# Patient Record
Sex: Female | Born: 1950 | Race: White | Hispanic: No | Smoking: Never smoker
Health system: Southern US, Community
[De-identification: ages and names within clinical notes are randomized; demographics above are authoritative.]

## PROBLEM LIST (undated history)

## (undated) DIAGNOSIS — M216X1 Other acquired deformities of right foot: Secondary | ICD-10-CM

## (undated) DIAGNOSIS — L509 Urticaria, unspecified: Secondary | ICD-10-CM

## (undated) DIAGNOSIS — J189 Pneumonia, unspecified organism: Secondary | ICD-10-CM

## (undated) DIAGNOSIS — D131 Benign neoplasm of stomach: Secondary | ICD-10-CM

## (undated) DIAGNOSIS — K219 Gastro-esophageal reflux disease without esophagitis: Secondary | ICD-10-CM

## (undated) DIAGNOSIS — I341 Nonrheumatic mitral (valve) prolapse: Secondary | ICD-10-CM

## (undated) DIAGNOSIS — T7840XA Allergy, unspecified, initial encounter: Secondary | ICD-10-CM

## (undated) DIAGNOSIS — M542 Cervicalgia: Secondary | ICD-10-CM

## (undated) DIAGNOSIS — G5761 Lesion of plantar nerve, right lower limb: Secondary | ICD-10-CM

## (undated) DIAGNOSIS — Z87442 Personal history of urinary calculi: Secondary | ICD-10-CM

## (undated) DIAGNOSIS — G4700 Insomnia, unspecified: Secondary | ICD-10-CM

## (undated) DIAGNOSIS — J309 Allergic rhinitis, unspecified: Secondary | ICD-10-CM

## (undated) DIAGNOSIS — K5792 Diverticulitis of intestine, part unspecified, without perforation or abscess without bleeding: Secondary | ICD-10-CM

## (undated) DIAGNOSIS — F419 Anxiety disorder, unspecified: Secondary | ICD-10-CM

## (undated) DIAGNOSIS — M810 Age-related osteoporosis without current pathological fracture: Secondary | ICD-10-CM

## (undated) DIAGNOSIS — M1990 Unspecified osteoarthritis, unspecified site: Secondary | ICD-10-CM

## (undated) HISTORY — DX: Diverticulitis of intestine, part unspecified, without perforation or abscess without bleeding: K57.92

## (undated) HISTORY — PX: ANKLE SURGERY: SHX546

## (undated) HISTORY — DX: Allergic rhinitis, unspecified: J30.9

## (undated) HISTORY — DX: Benign neoplasm of stomach: D13.1

## (undated) HISTORY — DX: Allergy, unspecified, initial encounter: T78.40XA

## (undated) HISTORY — PX: COLONOSCOPY: SHX174

## (undated) HISTORY — DX: Unspecified osteoarthritis, unspecified site: M19.90

## (undated) HISTORY — PX: TUBAL LIGATION: SHX77

## (undated) HISTORY — PX: UPPER GASTROINTESTINAL ENDOSCOPY: SHX188

## (undated) HISTORY — DX: Nonrheumatic mitral (valve) prolapse: I34.1

## (undated) HISTORY — DX: Age-related osteoporosis without current pathological fracture: M81.0

## (undated) HISTORY — DX: Insomnia, unspecified: G47.00

## (undated) HISTORY — PX: CHOLECYSTECTOMY: SHX55

## (undated) HISTORY — DX: Urticaria, unspecified: L50.9

## (undated) HISTORY — DX: Anxiety disorder, unspecified: F41.9

## (undated) HISTORY — PX: APPENDECTOMY: SHX54

---

## 1997-08-16 HISTORY — PX: ABDOMINAL HYSTERECTOMY: SHX81

## 2005-08-16 HISTORY — PX: COLONOSCOPY: SHX174

## 2011-04-17 HISTORY — PX: ESOPHAGOGASTRODUODENOSCOPY: SHX1529

## 2016-08-06 LAB — COLOGUARD: Cologuard: NEGATIVE

## 2016-12-22 DIAGNOSIS — M25512 Pain in left shoulder: Secondary | ICD-10-CM | POA: Insufficient documentation

## 2016-12-22 DIAGNOSIS — M542 Cervicalgia: Secondary | ICD-10-CM

## 2016-12-22 HISTORY — DX: Cervicalgia: M54.2

## 2017-02-23 ENCOUNTER — Encounter: Payer: Self-pay | Admitting: Internal Medicine

## 2017-03-28 DIAGNOSIS — M216X1 Other acquired deformities of right foot: Secondary | ICD-10-CM

## 2017-03-28 DIAGNOSIS — G5761 Lesion of plantar nerve, right lower limb: Secondary | ICD-10-CM | POA: Insufficient documentation

## 2017-03-28 HISTORY — DX: Lesion of plantar nerve, right lower limb: G57.61

## 2017-03-28 HISTORY — DX: Other acquired deformities of right foot: M21.6X1

## 2017-04-19 ENCOUNTER — Ambulatory Visit (INDEPENDENT_AMBULATORY_CARE_PROVIDER_SITE_OTHER): Payer: Medicare Other | Admitting: Internal Medicine

## 2017-04-19 ENCOUNTER — Encounter: Payer: Self-pay | Admitting: Internal Medicine

## 2017-04-19 ENCOUNTER — Encounter (INDEPENDENT_AMBULATORY_CARE_PROVIDER_SITE_OTHER): Payer: Self-pay

## 2017-04-19 VITALS — BP 126/74 | HR 121 | Ht 65.0 in | Wt 154.0 lb

## 2017-04-19 DIAGNOSIS — K2100 Gastro-esophageal reflux disease with esophagitis, without bleeding: Secondary | ICD-10-CM

## 2017-04-19 DIAGNOSIS — K449 Diaphragmatic hernia without obstruction or gangrene: Secondary | ICD-10-CM

## 2017-04-19 DIAGNOSIS — Z1211 Encounter for screening for malignant neoplasm of colon: Secondary | ICD-10-CM | POA: Diagnosis not present

## 2017-04-19 MED ORDER — PANTOPRAZOLE SODIUM 20 MG PO TBEC: 20.0000 mg | DELAYED_RELEASE_TABLET | Freq: Two times a day (BID) | ORAL | 11 refills | Status: DC

## 2017-04-19 NOTE — Patient Instructions (Addendum)
  You have been scheduled for an endoscopy. Please follow written instructions given to you at your visit today. If you use inhalers (even only as needed), please bring them with you on the day of your procedure. Your physician has requested that you go to www.startemmi.com and enter the access code given to you at your visit today. This web site gives a general overview about your procedure. However, you should still follow specific instructions given to you by our office regarding your preparation for the procedure.   We are giving you a handout to read and follow on GERD.   We will put you in the system for a colonoscopy recall for December 2020.   We have sent the following medications to your pharmacy for you to pick up at your convenience: Pantoprazole   I appreciate the opportunity to care for you. Silvano Rusk, MD, [facility]

## 2017-04-19 NOTE — Progress Notes (Signed)
Rebekah Schmidt 66 y.o. 1951/03/24 [id number]  Assessment & Plan:   Encounter Diagnoses  Name Primary?  . Gastroesophageal reflux disease with esophagitis Yes  . Hiatal hernia   . Colon cancer screening    She's having increasing symptoms despite treatment. She is worried about the possibility of Barrett's esophagus.  Will evaluate for mucosal damage with EGD.The risks and benefits as well as alternatives of endoscopic procedure(s) have been discussed and reviewed. All questions answered. The patient agrees to proceed.  Change PPI from omeprazole 20 mg daily to pantoprazole 20 mg twice a day before breakfast and supper. I advised her about the importance of taking it about 30-60 minutes before a meal for maximal effectiveness.  I think overall she is probably following reflux precautions with the exception of caffeine intake, she may be stuck on 2 cups of coffee a day. We will give her a diet and information sheet again for reinforcement. She will continue having the head of the bed elevated.  I would consider repeating a colonoscopy 3 years after her Cologuard versus repeating Cologuard. December 2020 would be the next time for a screening test. We will place a recall in our systems with plans of reaching out to her at that time.  I appreciate the opportunity to care for this patient. CC: Hungarland, Jenetta Downer, MD   Subjective:   Chief Complaint: Reflux and hiatal hernia  HPI  The patient is a very nice 66 year old disabled/retired married white woman from [place] who has had years of heartburn and reflux issues. She is a friend of Darrin Luis Things are bit worse. She is on omeprazole 20 mg daily for 5 years or more. She had an upper GI endoscopy with a 4 cm hiatal hernia seen on 04/20/2011, and suspicion of possible Barrett's esophagus. 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 The patient's husband has had a difficult time with reflux and Barrett's esophagus and for a time was getting endoscopy evaluations every several months but is now every 2-3 years through the [facility] the a [employer]. She is concerned about this and especially since her symptoms are worse, if she bends over she'll regurgitate never reflux, she has gas and pressure symptoms more so and some heartburn no that's not the predominant symptom at this time. Occasionally she'll some cough issues which she thinks her related allergies there is no hoarseness. No dysphagia. Mild weight increase. She does have osteoporosis and recalls being told that she should try to minimize her PPI use, she has tried multiple bisphosphonates and other treatments and has been unable to tolerate those due to GI side effects. I don't know if she's been on any of the IV medications.  She does drink 2 cups of coffee a day, about 8 ounces each. That's the only caffeine, no tobacco, she has the head of bed elevated. She tries to follow a reflux diet otherwise she tells me.  Colonoscopy in 2007 was negative for polyp she says and in December 2017 she did a Cologuard that was negative.  GI review of systems is otherwise negative. No Known Allergies Current Meds  Medication Sig  . Calcium Carbonate-Vitamin D (CALCIUM 500 + D PO) Take by mouth.  . Cholecalciferol (VITAMIN D PO) Take by mouth.  . Cyclobenzaprine HCl (FLEXERIL PO) Take 10 mg by mouth as needed.  Marland Kitchen ibuprofen (ADVIL,MOTRIN) 200 MG tablet Take 200 mg  by mouth every 6 (six) hours as needed.  . Ibuprofen-Diphenhydramine Cit (ADVIL PM PO) Take by mouth.  Marland Kitchen LORazepam (ATIVAN) 0.5 MG tablet   . Melatonin 5 MG TABS Take by mouth.  . Multiple Vitamins-Minerals (PRESERVISION AREDS PO) Take by mouth.  . zolpidem (AMBIEN) 10 MG tablet   . [DISCONTINUED] omeprazole (PRILOSEC) 20 MG capsule    Past Medical History:  Diagnosis Date  . Allergic  rhinitis   . Anxiety   . Arthritis   . Hiatal hernia with GERD and esophagitis 2012  . Insomnia   . Kidney calculi   . MVP (mitral valve prolapse)    Past Surgical History:  Procedure Laterality Date  . ANKLE SURGERY    . APPENDECTOMY    . CHOLECYSTECTOMY    . COLONOSCOPY  2007   Negative  . ESOPHAGOGASTRODUODENOSCOPY  04/2011   Hiatal hernia with esophagitis no Barrett's  . MITRAL VALVE REPAIR     Social History   Social History  . Marital status: Married    Spouse name: N/A  . Number of children: 1  . Years of education: N/A   Occupational History  . retired    Social History Main Topics  . Smoking status: Never Smoker  . Smokeless tobacco: Never Used  . Alcohol use Yes     Comment: rare  . Drug use: No  . Sexual activity: Not Currently    Partners: Male    Birth control/ protection: Surgical    Social History Narrative   Married and lives with her husband in [place], her one daughter is a Marine scientist in [place].   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 Some back pain, allergies, stress urinary incontinence with sneezing and coughing, she has a metatarsal problem on the right foot she gets muscle pains at times. All other review of systems are negative. Objective:   Physical Exam @BP  126/74   Pulse (!) 121   Ht 5\' 5"  (1.651 m)   Wt 154 lb (69.9 kg)   BMI 25.63 kg/m @  General:  Well-developed, well-nourished and in no acute distress Eyes:  anicteric. ENT:   Mouth and posterior pharynx free of lesions.  Neck:   supple w/o thyromegaly or mass.  Lungs: Clear to auscultation bilaterally. Heart:  S1S2, no rubs,  murmurs, gallops. Abdomen:  soft, non-tender, no hepatosplenomegaly, hernia, or mass and BS+.  Lymph:  no cervical or supraclavicular adenopathy. Extremities:   no edema, cyanosis or clubbing Skin   no rash. Neuro:  A&O x 3.  Psych:  appropriate mood and  Affect.   Data Reviewed: As per history of present illness

## 2017-06-24 ENCOUNTER — Other Ambulatory Visit: Payer: Self-pay

## 2017-06-24 ENCOUNTER — Ambulatory Visit (AMBULATORY_SURGERY_CENTER): Payer: Medicare Other | Admitting: Internal Medicine

## 2017-06-24 ENCOUNTER — Encounter: Payer: Self-pay | Admitting: Internal Medicine

## 2017-06-24 VITALS — BP 141/78 | HR 95 | Temp 98.6°F | Resp 12 | Ht 65.0 in | Wt 154.0 lb

## 2017-06-24 DIAGNOSIS — K317 Polyp of stomach and duodenum: Secondary | ICD-10-CM | POA: Diagnosis not present

## 2017-06-24 DIAGNOSIS — K2100 Gastro-esophageal reflux disease with esophagitis, without bleeding: Secondary | ICD-10-CM

## 2017-06-24 HISTORY — PX: UPPER GASTROINTESTINAL ENDOSCOPY: SHX188

## 2017-06-24 MED ORDER — SODIUM CHLORIDE 0.9 % IV SOLN: 500.0000 mL | INTRAVENOUS | Status: DC

## 2017-06-24 NOTE — Op Note (Signed)
[facility] Patient Name: Rebekah Schmidt Procedure Date: 06/24/2017 11:41 AM MRN: [id number] Endoscopist: Gatha Mayer , MD Age: 66 Referring MD:  Date of Birth: [birth year] Gender: Female Account #: [id number] Procedure:                Upper GI endoscopy Indications:              Esophageal reflux, Follow-up of esophageal reflux Medicines:                Propofol per Anesthesia, Monitored Anesthesia Care Procedure:                Pre-Anesthesia Assessment:                           - Prior to the procedure, a History and Physical                            was performed, and patient medications and                            allergies were reviewed. The patient's tolerance of                            previous anesthesia was also reviewed. The risks                            and benefits of the procedure and the sedation                            options and risks were discussed with the patient.                            All questions were answered, and informed consent                            was obtained. Prior Anticoagulants: The patient has                            taken no previous anticoagulant or antiplatelet                            agents. ASA Grade Assessment: II - A patient with                            mild systemic disease. After reviewing the risks                            and benefits, the patient was deemed in                            satisfactory condition to undergo the procedure.                           After obtaining informed consent, the endoscope was  passed under direct vision. Throughout the                            procedure, the patient's blood pressure, pulse, and                            oxygen saturations were monitored continuously. The                            Endoscope was introduced through the mouth, and                            advanced to the second part of duodenum. The upper                     GI endoscopy was accomplished without difficulty.                            The patient tolerated the procedure well. Scope In: Scope Out: Findings:                 A few 3 to 8 mm semi-sessile polyps with no                            bleeding and no stigmata of recent bleeding were                            found in the gastric body. Biopsies were taken with                            a cold forceps for histology. Verification of                            patient identification for the specimen was done.                            Estimated blood loss was minimal.                           The Z-line was irregular.                           The exam was otherwise without abnormality.                           The cardia and gastric fundus were normal on                            retroflexion. Complications:            No immediate complications. Estimated Blood Loss:     Estimated blood loss was minimal. Impression:               - A few gastric polyps. Biopsied.                           -  Z-line irregular.                           - The examination was otherwise normal. Recommendation:           - Patient has a contact number available for                            emergencies. The signs and symptoms of potential                            delayed complications were discussed with the                            patient. Return to normal activities tomorrow.                            Written discharge instructions were provided to the                            patient.                           - Resume previous diet.                           - Continue present medications.                           - Await pathology results. Gatha Mayer, MD 06/24/2017 12:02:12 PM This report has been signed electronically.

## 2017-06-24 NOTE — Progress Notes (Signed)
Called to room to assist during endoscopic procedure.  Patient ID and intended procedure confirmed with present staff. Received instructions for my participation in the procedure from the performing physician.  

## 2017-06-24 NOTE — Progress Notes (Signed)
Report to RN, VSS, adequate respirations noted, no c/o pain or discomfort 

## 2017-06-24 NOTE — Patient Instructions (Addendum)
   There were a few stomach polyps - look very benign. I took biopsies. The esophagus and all else ok - no Barrett's esophagus.  I appreciate the opportunity to care for you. Gatha Mayer, MD, FACG  YOU HAD AN ENDOSCOPIC PROCEDURE TODAY AT [facility]:   Refer to the procedure report that was given to you for any specific questions about what was found during the examination.  If the procedure report does not answer your questions, please call your gastroenterologist to clarify.  If you requested that your care partner not be given the details of your procedure findings, then the procedure report has been included in a sealed envelope for you to review at your convenience later.  YOU SHOULD EXPECT: Some feelings of bloating in the abdomen. Passage of more gas than usual.  Walking can help get rid of the air that was put into your GI tract during the procedure and reduce the bloating. If you had a lower endoscopy (such as a colonoscopy or flexible sigmoidoscopy) you may notice spotting of blood in your stool or on the toilet paper. If you underwent a bowel prep for your procedure, you may not have a normal bowel movement for a few days.  Please Note:  You might notice some irritation and congestion in your nose or some drainage.  This is from the oxygen used during your procedure.  There is no need for concern and it should clear up in a day or so.  SYMPTOMS TO REPORT IMMEDIATELY:  Following upper endoscopy (EGD)  Vomiting of blood or coffee ground material  New chest pain or pain under the shoulder blades  Painful or persistently difficult swallowing  New shortness of breath  Fever of 100F or higher  Black, tarry-looking stools  For urgent or emergent issues, a gastroenterologist can be reached at any hour by calling [phone].   DIET:  We do recommend a small meal at first, but then you may proceed to your regular diet.  Drink plenty of fluids but you should  avoid alcoholic beverages for 24 hours.  ACTIVITY:  You should plan to take it easy for the rest of today and you should NOT DRIVE or use heavy machinery until tomorrow (because of the sedation medicines used during the test).    FOLLOW UP: Our staff will call the number listed on your records the next business day following your procedure to check on you and address any questions or concerns that you may have regarding the information given to you following your procedure. If we do not reach you, we will leave a message.  However, if you are feeling well and you are not experiencing any problems, there is no need to return our call.  We will assume that you have returned to your regular daily activities without incident.  If any biopsies were taken you will be contacted by phone or by letter within the next 1-3 weeks.  Please call us at [phone] if you have not heard about the biopsies in 3 weeks.    SIGNATURES/CONFIDENTIALITY: You and/or your care partner have signed paperwork which will be entered into your electronic medical record.  These signatures attest to the fact that that the information above on your After Visit Summary has been reviewed and is understood.  Full responsibility of the confidentiality of this discharge information lies with you and/or your care-partner.

## 2017-06-24 NOTE — Progress Notes (Signed)
10-12 woke up at 2 am with severe pain in her back- started lower back and radiated up back - felt like needed to burp- lasted about 1 hour- took a flexeril and walked around and it finally relaxed-   No egg/ soy allergy  Pt's states no medical or surgical changes since previsit or office visit.

## 2017-06-27 ENCOUNTER — Telehealth: Payer: Self-pay

## 2017-06-27 NOTE — Telephone Encounter (Signed)
  Follow up Call-  Call back number 06/24/2017  Post procedure Call Back phone  # [phone]  Permission to leave phone message Yes  Some recent data might be hidden     Patient questions:  Do you have a fever, pain , or abdominal swelling? No. Pain Score  0 *  Have you tolerated food without any problems? Yes.    Have you been able to return to your normal activities? Yes.    Do you have any questions about your discharge instructions: Diet   No. Medications  No. Follow up visit  No.  Do you have questions or concerns about your Care? No.  Actions: * If pain score is 4 or above: No action needed, pain <4.  Pt was asleep.  I spoke with her husband.  No problems noted per husband. maw

## 2017-06-29 ENCOUNTER — Encounter: Payer: Self-pay | Admitting: Internal Medicine

## 2017-06-29 NOTE — Progress Notes (Signed)
Fundic gland polyps No recall needed

## 2018-05-10 ENCOUNTER — Encounter: Payer: Self-pay | Admitting: Physician Assistant

## 2018-05-18 ENCOUNTER — Encounter: Payer: Self-pay | Admitting: Physician Assistant

## 2018-05-18 ENCOUNTER — Encounter

## 2018-05-18 ENCOUNTER — Ambulatory Visit (INDEPENDENT_AMBULATORY_CARE_PROVIDER_SITE_OTHER): Payer: Medicare Other | Admitting: Physician Assistant

## 2018-05-18 VITALS — BP 152/90 | HR 108 | Ht 65.0 in | Wt 157.0 lb

## 2018-05-18 DIAGNOSIS — R194 Change in bowel habit: Secondary | ICD-10-CM

## 2018-05-18 NOTE — Patient Instructions (Signed)
You have been scheduled for a colonoscopy. Please follow written instructions given to you at your visit today.  Please pick up your prep supplies at the pharmacy within the next 1-3 days. If you use inhalers (even only as needed), please bring them with you on the day of your procedure. Your physician has requested that you go to www.startemmi.com and enter the access code given to you at your visit today. This web site gives a general overview about your procedure. However, you should still follow specific instructions given to you by our office regarding your preparation for the procedure.  Start Miralax daily, 17 grams (1 capful) in 8 oz of water or juice.

## 2018-05-18 NOTE — Progress Notes (Signed)
Chief Complaint: Change in bowel habits  HPI:     Rebekah Schmidt is a 67 year old Caucasian female with a past medical history as listed below, known to Dr. Carlean Purl, who was referred to me by Hungarland, Jenetta Downer,* for a complaint of constipation and abdominal pain.       04/19/2017 saw Dr. Carlean Purl for reflux.  At that time patient had an EGD.  She was also changed from Omeprazole 20 to Pantoprazole 20 twice a day.  It was discussed that she would need a repeat colonoscopy 3 years after her Cologuard versus repeating Cologuard.  December 2020 will be the next time of her screening test.    Today, patient tells me that she has had a change in her bowel habits over the summer.  Typically, she would wake up and have some gas and then have a normal bowel movement but over the summer she started having to strain for only a small hard piece of stool.  She would then feel that she had to go at least 4-5 times a day and would still only produce a small amount of stool.  She has used a Dulcolax liquid/stool softener over this course of time and typically this works for her "eventually" if she uses it, she has also tried MiraLAX twice a day for a few days which "always takes a few days to work".  She has not taken anything consistently.  Associated symptoms include generalized abdominal bloating and generalized pain which wraps around to her back.    Denies fever, chills, blood in her stool, weight loss, anorexia, nausea, vomiting, heartburn or reflux.  Past Medical History:  Diagnosis Date  . Allergic rhinitis   . Allergy   . Anxiety   . Arthritis   . Benign fundic gland polyps of stomach   . Hiatal hernia with GERD and esophagitis 2012  . Insomnia   . Kidney calculi   . MVP (mitral valve prolapse)   . Osteoporosis     Past Surgical History:  Procedure Laterality Date  . ANKLE SURGERY    . APPENDECTOMY    . CHOLECYSTECTOMY    . COLONOSCOPY  2007   Negative  . ESOPHAGOGASTRODUODENOSCOPY  04/2011     Hiatal hernia with esophagitis no Barrett's, repeat 2018 - fundic gland polyps  . MITRAL VALVE REPAIR    . UPPER GASTROINTESTINAL ENDOSCOPY      Current Outpatient Medications  Medication Sig Dispense Refill  . Calcium Carbonate-Vitamin D (CALCIUM 500 + D PO) Take by mouth.    . Cholecalciferol (VITAMIN D PO) Take by mouth.    . Cyclobenzaprine HCl (FLEXERIL PO) Take 10 mg by mouth as needed.    Marland Kitchen ibuprofen (ADVIL,MOTRIN) 200 MG tablet Take 200 mg by mouth every 6 (six) hours as needed.    . Ibuprofen-Diphenhydramine Cit (ADVIL PM PO) Take by mouth.    Marland Kitchen LORazepam (ATIVAN) 0.5 MG tablet     . Melatonin 5 MG TABS Take by mouth.    . Multiple Vitamins-Minerals (PRESERVISION AREDS PO) Take by mouth.    . pantoprazole (PROTONIX) 20 MG tablet Take 1 tablet (20 mg total) by mouth 2 (two) times daily before a meal. Breakfast and supper 60 tablet 11  . zolpidem (AMBIEN) 10 MG tablet      Current Facility-Administered Medications  Medication Dose Route Frequency Provider Last Rate Last Dose  . 0.9 %  sodium chloride infusion  500 mL Intravenous Continuous Gatha Mayer, MD  Allergies as of 05/18/2018 - Review Complete 05/18/2018  Allergen Reaction Noted  . Morphine and related Itching 05/18/2018    Family History  Problem Relation Age of Onset  . Stomach cancer Neg Hx   . Colon cancer Neg Hx   . Colon polyps Neg Hx   . Esophageal cancer Neg Hx   . Rectal cancer Neg Hx     Social History   Socioeconomic History  . Marital status: Married    Spouse name: Not on file  . Number of children: 1  . Years of education: Not on file  . Highest education level: Not on file  Occupational History  . Occupation: retired  Scientific laboratory technician  . Financial resource strain: Not on file  . Food insecurity:    Worry: Not on file    Inability: Not on file  . Transportation needs:    Medical: Not on file    Non-medical: Not on file  Tobacco Use  . Smoking status: Never Smoker  .  Smokeless tobacco: Never Used  Substance and Sexual Activity  . Alcohol use: Yes    Comment: rare  . Drug use: No  . Sexual activity: Not Currently    Partners: Male    Birth control/protection: Surgical  Lifestyle  . Physical activity:    Days per week: Not on file    Minutes per session: Not on file  . Stress: Not on file  Relationships  . Social connections:    Talks on phone: Not on file    Gets together: Not on file    Attends religious service: Not on file    Active member of club or organization: Not on file    Attends meetings of clubs or organizations: Not on file    Relationship status: Not on file  . Intimate partner violence:    Fear of current or ex partner: Not on file    Emotionally abused: Not on file    Physically abused: Not on file    Forced sexual activity: Not on file  Other Topics Concern  . Not on file  Social History Narrative   Married and lives with her husband in [place], her one daughter is a Marine scientist in [place].   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 Lungs clear to auscultation bilaterally.   No wheezes, crackles, or rhonchi.  Cardiovascular: Normal S1, S2. No MRG. Regular rate and rhythm. No peripheral edema, cyanosis or pallor.  Gastrointestinal:  Soft, nondistended,  mild generalized ttp, No rebound or guarding. Decreased BS all four quadrants, No appreciable masses or hepatomegaly. Psychiatric: Demonstrates good judgement and reason without abnormal affect or behaviors.  No recent labs or imaging.  Assessment:  1.  Change in bowel habits: Towards constipation for the past 4 months, minimal relief with Dulcolax/MiraLAX, last Cologuard 2 years ago was negative, patient concerned she has not had a recent colonoscopy; consider slow transit versus large polyp versus other  Plan: 1.  Scheduled patient for a diagnostic colonoscopy in the [facility] with Dr. Carlean Purl for change in bowel habits.  Did discuss risk, benefits, limitations and alternatives and the patient agrees to proceed. 2.  Would recommend the patient use MiraLAX on a daily basis.  Did discuss titration of this. 3.  Patient to follow in clinic per recommendations from Dr. Carlean Purl after time of procedure.  Ellouise Newer, PA-C [facility] 05/18/2018, 9:28 AM  Cc: Hungarland, Jenetta Downer,*

## 2018-05-22 ENCOUNTER — Encounter: Payer: Self-pay | Admitting: Internal Medicine

## 2018-05-22 ENCOUNTER — Encounter (HOSPITAL_COMMUNITY): Payer: Self-pay

## 2018-05-22 ENCOUNTER — Emergency Department (HOSPITAL_COMMUNITY): Payer: Medicare Other

## 2018-05-22 ENCOUNTER — Ambulatory Visit (AMBULATORY_SURGERY_CENTER): Payer: Medicare Other | Admitting: Internal Medicine

## 2018-05-22 ENCOUNTER — Emergency Department (HOSPITAL_COMMUNITY)
Admission: EM | Admit: 2018-05-22 | Discharge: 2018-05-22 | Disposition: A | Discharge status: Home / Self Care | Payer: Medicare Other | Attending: Emergency Medicine | Admitting: Emergency Medicine

## 2018-05-22 ENCOUNTER — Other Ambulatory Visit: Payer: Self-pay

## 2018-05-22 VITALS — BP 148/90 | HR 94 | Temp 99.3°F | Resp 16 | Ht 65.0 in | Wt 157.0 lb

## 2018-05-22 DIAGNOSIS — R110 Nausea: Secondary | ICD-10-CM | POA: Diagnosis not present

## 2018-05-22 DIAGNOSIS — K6389 Other specified diseases of intestine: Secondary | ICD-10-CM | POA: Diagnosis not present

## 2018-05-22 DIAGNOSIS — Z79899 Other long term (current) drug therapy: Secondary | ICD-10-CM | POA: Insufficient documentation

## 2018-05-22 DIAGNOSIS — R1084 Generalized abdominal pain: Secondary | ICD-10-CM | POA: Diagnosis not present

## 2018-05-22 DIAGNOSIS — R194 Change in bowel habit: Secondary | ICD-10-CM | POA: Diagnosis not present

## 2018-05-22 LAB — COMPREHENSIVE METABOLIC PANEL
ALBUMIN: 4.1 g/dL (ref 3.5–5.0)
ALT: 23 U/L (ref 0–44)
AST: 23 U/L (ref 15–41)
Alkaline Phosphatase: 114 U/L (ref 38–126)
Anion gap: 13 (ref 5–15)
BUN: 7 mg/dL — AB (ref 8–23)
CO2: 21 mmol/L — ABNORMAL LOW (ref 22–32)
CREATININE: 0.61 mg/dL (ref 0.44–1.00)
Calcium: 8.9 mg/dL (ref 8.9–10.3)
Chloride: 110 mmol/L (ref 98–111)
GFR calc Af Amer: >60 mL/min (ref >60)
GLUCOSE: 89 mg/dL (ref 70–99)
Potassium: 3.6 mmol/L (ref 3.5–5.1)
Sodium: 144 mmol/L (ref 135–145)
Total Bilirubin: 0.5 mg/dL (ref 0.3–1.2)
Total Protein: 7.4 g/dL (ref 6.5–8.1)

## 2018-05-22 LAB — CBC WITH DIFFERENTIAL/PLATELET
Basophils Absolute: 0 10*3/uL (ref 0.0–0.1)
Basophils Relative: 0 %
EOS ABS: 0.1 10*3/uL (ref 0.0–0.7)
EOS PCT: 0 %
HCT: 42.3 % (ref 36.0–46.0)
Hemoglobin: 13.3 g/dL (ref 12.0–15.0)
LYMPHS PCT: 14 %
Lymphs Abs: 1.8 10*3/uL (ref 0.7–4.0)
MCH: 28.9 pg (ref 26.0–34.0)
MCHC: 31.4 g/dL (ref 30.0–36.0)
MCV: 91.8 fL (ref 78.0–100.0)
MONO ABS: 0.6 10*3/uL (ref 0.1–1.0)
MONOS PCT: 4 %
Neutro Abs: 10 10*3/uL — ABNORMAL HIGH (ref 1.7–7.7)
Neutrophils Relative %: 82 %
PLATELETS: 315 10*3/uL (ref 150–400)
RBC: 4.61 MIL/uL (ref 3.87–5.11)
RDW: 12.7 % (ref 11.5–15.5)
WBC: 12.4 10*3/uL — AB (ref 4.0–10.5)

## 2018-05-22 MED ORDER — SODIUM CHLORIDE 0.9 % IV BOLUS
500.0000 mL | Freq: Once | INTRAVENOUS | Status: AC
  Administered 2018-05-22: 500 mL via INTRAVENOUS

## 2018-05-22 MED ORDER — FENTANYL CITRATE (PF) 100 MCG/2ML IJ SOLN
25.0000 ug | Freq: Once | INTRAMUSCULAR | Status: AC
  Administered 2018-05-22: 25 ug via INTRAVENOUS

## 2018-05-22 MED ORDER — SODIUM CHLORIDE 0.9 % IJ SOLN
INTRAMUSCULAR | Status: AC
  Filled 2018-05-22: qty 50

## 2018-05-22 MED ORDER — SODIUM CHLORIDE 0.9 % IV SOLN
4.0000 mg | Freq: Once | INTRAVENOUS | Status: AC
  Administered 2018-05-22: 4 mg via INTRAVENOUS

## 2018-05-22 MED ORDER — IOPAMIDOL (ISOVUE-300) INJECTION 61%
100.0000 mL | Freq: Once | INTRAVENOUS | Status: AC | PRN
  Administered 2018-05-22: 100 mL via INTRAVENOUS

## 2018-05-22 MED ORDER — SODIUM CHLORIDE 0.9 % IV SOLN: 500.0000 mL | Freq: Once | INTRAVENOUS | Status: DC

## 2018-05-22 MED ORDER — IOPAMIDOL (ISOVUE-300) INJECTION 61%
INTRAVENOUS | Status: AC
  Filled 2018-05-22: qty 100

## 2018-05-22 MED ORDER — FENTANYL CITRATE (PF) 100 MCG/2ML IJ SOLN
50.0000 ug | Freq: Once | INTRAMUSCULAR | Status: AC
  Administered 2018-05-22: 50 ug via INTRAVENOUS
  Filled 2018-05-22: qty 2

## 2018-05-22 NOTE — ED Provider Notes (Signed)
[facility] DEPT Provider Note   CSN: [id number] Arrival date & time: 05/22/18  9150     History   Chief Complaint Chief Complaint  Patient presents with  . Abdominal Pain    HPI Rebekah Schmidt is a 67 y.o. female.  The history is provided by the patient. No language interpreter was used.  Abdominal Pain     Rebekah Schmidt is a 67 y.o. female who presents to the Emergency Department complaining of abdominal pain. She is referred to the emergency department from endoscopy suite for evaluation of abdominal pain. She had a colonoscopy performed earlier today for constipation. There was difficulty in the procedure due to a sigmoid structure. Following completion of the procedure she developed severe generalized abdominal pain that is cramping in nature. She describes it as labor pains. She has associated nausea but no vomiting. She has been able to pass limited flatus. No prior similar symptoms. Past Medical History:  Diagnosis Date  . Allergic rhinitis   . Allergy   . Anxiety   . Arthritis   . Benign fundic gland polyps of stomach   . Insomnia   . Kidney calculi 2017  . MVP (mitral valve prolapse)   . Osteoporosis     There are no active problems to display for this patient.   Past Surgical History:  Procedure Laterality Date  . ABDOMINAL HYSTERECTOMY  1999  . ANKLE SURGERY    . APPENDECTOMY    . CHOLECYSTECTOMY    . COLONOSCOPY  2007   Negative  . ESOPHAGOGASTRODUODENOSCOPY  04/2011   Hiatal hernia with esophagitis no Barrett's, repeat 2018 - fundic gland polyps  . UPPER GASTROINTESTINAL ENDOSCOPY       OB History   None      Home Medications    Prior to Admission medications   Medication Sig Start Date End Date Taking? Authorizing Provider  Calcium Carbonate-Vitamin D (CALCIUM 500 + D PO) Take 1 tablet by mouth daily.    Yes [provider]  Cholecalciferol (VITAMIN D PO) Take 1 tablet by mouth daily.    Yes [provider]  Cyclobenzaprine HCl (FLEXERIL PO) Take 10 mg by mouth as needed.   Yes [provider]  Ibuprofen-diphenhydrAMINE Cit (ADVIL PM PO) Take 1 tablet by mouth as needed (sleep).    Yes [provider]  LORazepam (ATIVAN) 0.5 MG tablet Take 0.5 mg by mouth as needed for anxiety.  12/14/16  Yes [provider]  Melatonin 5 MG TABS Take 5 mg by mouth as needed.    Yes [provider]  Multiple Vitamins-Minerals (PRESERVISION AREDS PO) Take by mouth.   Yes [provider]  oxymetazoline (AFRIN) 0.05 % nasal spray Place 1 spray into both nostrils 2 (two) times daily.   Yes [provider]  pantoprazole (PROTONIX) 20 MG tablet Take 1 tablet (20 mg total) by mouth 2 (two) times daily before a meal. Breakfast and supper 04/19/17  Yes Gatha Mayer, MD  Probiotic Product (PROBIOTIC DAILY PO) Take by mouth.   Yes [provider]  zolpidem (AMBIEN) 10 MG tablet Take 5-10 mg by mouth once.  11/06/16  Yes [provider]    Family History Family History  Problem Relation Age of Onset  . Stomach cancer Neg Hx   . Colon cancer Neg Hx   . Colon polyps Neg Hx   . Esophageal cancer Neg Hx   . Rectal cancer Neg Hx  Social History Social History   Tobacco Use  . Smoking status: Never Smoker  . Smokeless tobacco: Never Used  Substance Use Topics  . Alcohol use: Yes    Comment: rare  . Drug use: No     Allergies   Morphine and related   Review of Systems Review of Systems  Gastrointestinal: Positive for abdominal pain.  All other systems reviewed and are negative.    Physical Exam Updated Vital Signs BP (!) 153/92 (BP Location: Left Arm)   Pulse (!) 104   Temp 97.9 F (36.6 C)   Resp 15   Ht 5\' 5"  (1.651 m)   Wt 70.3 kg   SpO2 97%   BMI 25.79 kg/m   Physical Exam  Constitutional: She is oriented to person, place, and time. She appears well-developed and well-nourished.  HENT:  Head:  Normocephalic and atraumatic.  Cardiovascular: Regular rhythm.  No murmur heard. Tachycardic  Pulmonary/Chest: Effort normal and breath sounds normal. No respiratory distress.  Abdominal:  Mildly distended abdomen. Moderate generalized tenderness without guarding or rebound.  Musculoskeletal: She exhibits no edema or tenderness.  Neurological: She is alert and oriented to person, place, and time.  Skin: Skin is warm and dry.  Psychiatric: She has a normal mood and affect. Her behavior is normal.  Nursing note and vitals reviewed.    ED Treatments / Results  Labs (all labs ordered are listed, but only abnormal results are displayed) Labs Reviewed  COMPREHENSIVE METABOLIC PANEL - Abnormal; Notable for the following components:      Result Value   CO2 21 (*)    BUN 7 (*)    All other components within normal limits  CBC WITH DIFFERENTIAL/PLATELET - Abnormal; Notable for the following components:   WBC 12.4 (*)    Neutro Abs 10.0 (*)    All other components within normal limits    EKG None  Radiology Ct Abdomen Pelvis W Contrast  Result Date: 05/22/2018 CLINICAL DATA:  Abdominal pain following colonoscopy EXAM: CT ABDOMEN AND PELVIS WITH CONTRAST TECHNIQUE: Multidetector CT imaging of the abdomen and pelvis was performed using the standard protocol following bolus administration of intravenous contrast. CONTRAST:  161mL ISOVUE-300 IOPAMIDOL (ISOVUE-300) INJECTION 61% COMPARISON:  None. FINDINGS: Lower chest: Lung bases demonstrate some minimal atelectatic change although no focal infiltrate or effusion is seen. Hepatobiliary: No focal liver abnormality is seen. Status post cholecystectomy. No biliary dilatation. Pancreas: Unremarkable. No pancreatic ductal dilatation or surrounding inflammatory changes. Spleen: Normal in size without focal abnormality. Adrenals/Urinary Tract: Adrenal glands are within normal limits. Kidneys are well visualized bilaterally. No obstructive changes are  seen. No renal calculi are noted. Peripelvic cysts are noted on the left. The bladder is well distended. Stomach/Bowel: The appendix is not visualized consistent with prior surgical history. Diffuse diverticular changes noted in the sigmoid colon with smooth muscle hypertrophy and wall thickening similar to that seen on recent colonoscopy. There are no findings to suggest acute perforation. Vascular/Lymphatic: Aortic atherosclerosis. No enlarged abdominal or pelvic lymph nodes. Reproductive: Status post hysterectomy. No adnexal masses. Other: No abdominal wall hernia or abnormality. No abdominopelvic ascites. Musculoskeletal: Degenerative changes of lumbar spine are noted without acute abnormality. IMPRESSION: No evidence of perforation following colonoscopy. There is some retained air within the proximal colon identified. Changes consistent with diverticulosis with wall thickening and narrowing similar to that seen on recent colonoscopy. No acute abnormality is identified. Electronically Signed   By: Inez Catalina M.D.   On: 05/22/2018 20:38  Procedures Procedures (including critical care time)  Medications Ordered in ED Medications  iopamidol (ISOVUE-300) 61 % injection (has no administration in time range)  sodium chloride 0.9 % injection (has no administration in time range)  sodium chloride 0.9 % bolus 500 mL (0 mLs Intravenous Stopped 05/22/18 1902)  iopamidol (ISOVUE-300) 61 % injection 100 mL (100 mLs Intravenous Contrast Given 05/22/18 1909)  fentaNYL (SUBLIMAZE) injection 50 mcg (50 mcg Intravenous Given 05/22/18 1949)     Initial Impression / Assessment and Plan / ED Course  I have reviewed the triage vital signs and the nursing notes.  Pertinent labs & imaging results that were available during my care of the patient were reviewed by me and considered in my medical decision making (see chart for details).     Patient here for evaluation of generalized abdominal pain following  colonoscopy. Patient has significant tenderness on initial evaluation. CT abdomen and pelvis was obtained to rule out perforation or diverticulitis. CT abdomen obtained and is negative for acute disease process. On repeat assessment she is feeling improved and has been able to pass some flatus. Counseled patient on home care for abdominal pain following a colonoscopy. Discussed outpatient follow-up as well as return precautions.  Final Clinical Impressions(s) / ED Diagnoses   Final diagnoses:  Generalized abdominal pain    ED Discharge Orders    None       Quintella Reichert, MD 05/22/18 2253

## 2018-05-22 NOTE — Op Note (Signed)
[facility] Patient Name: Rebekah Schmidt Procedure Date: 05/22/2018 2:54 PM MRN: [id number] Endoscopist: Gatha Mayer , MD Age: 67 Referring MD:  Date of Birth: 1950-10-04 Gender: Female Account #: [id number] Procedure:                Colonoscopy Indications:              Change in bowel habits Medicines:                Propofol per Anesthesia, Monitored Anesthesia Care Procedure:                Pre-Anesthesia Assessment:                           - Prior to the procedure, a History and Physical                            was performed, and patient medications and                            allergies were reviewed. The patient's tolerance of                            previous anesthesia was also reviewed. The risks                            and benefits of the procedure and the sedation                            options and risks were discussed with the patient.                            All questions were answered, and informed consent                            was obtained. Prior Anticoagulants: The patient has                            taken no previous anticoagulant or antiplatelet                            agents. ASA Grade Assessment: II - A patient with                            mild systemic disease. After reviewing the risks                            and benefits, the patient was deemed in                            satisfactory condition to undergo the procedure.                           After obtaining informed consent, the colonoscope  was passed under direct vision. Throughout the                            procedure, the patient's blood pressure, pulse, and                            oxygen saturations were monitored continuously. The                            Endoscope was introduced through the anus and                            advanced to the the cecum, identified by the                            ileocecal valve. The  colonoscopy was performed with                            difficulty due to restricted mobility of the colon.                            Successful completion of the procedure was aided by                            using manual pressure and withdrawing the scope and                            replacing with the adult endoscope. The patient                            tolerated the procedure well. The quality of the                            bowel preparation was excellent. The ileocecal                            valve and the rectum were photographed. Scope In: 3:19:23 PM Scope Out: 3:31:17 PM Scope Withdrawal Time: 0 hours 5 minutes 1 second  Total Procedure Duration: 0 hours 11 minutes 54 seconds  Findings:                 The perianal and digital rectal examinations were                            normal.                           A severe stenosis was found in the sigmoid colon                            and was traversed. Changed to gastroscope                           Multiple diverticula were found in the sigmoid  colon. There was narrowing of the colon in                            association with the diverticular opening.                           The exam was otherwise without abnormality on                            direct and retroflexion views. Complications:            No immediate complications. Estimated Blood Loss:     Estimated blood loss: none. Impression:               - Stricture in the sigmoid colon.                           - Severe diverticulosis in the sigmoid colon. There                            was narrowing of the colon in association with the                            diverticular opening.                           - The examination was otherwise normal on direct                            and retroflexion views. CECUM SEEN, NOT ENTERED -                            DESPITE PRESSURE AND MOVING HER COULD NOT ENTER                             WITH THE GASTROSCOPE WHICH WAS NECESSARY TO PASS                            SIGMOID STRICTURE (ADULT AND PEDIATRIC COLONOSCOPES                            WOULD NOT PASS)                           - No specimens collected. Recommendation:           - Patient has a contact number available for                            emergencies. The signs and symptoms of potential                            delayed complications were discussed with the                            patient. Return to normal  activities tomorrow.                            Written discharge instructions were provided to the                            patient.                           - High fiber diet.                           - Continue present medications.                           - No recommendation at this time regarding repeat                            colonoscopy.                           - I think she should have a CT abd/pelvis - will                            discuss and schedule                           Daily MiraLax                           High fiber diet                           ? if problems from diverticulosis vs other process Gatha Mayer, MD 05/22/2018 3:40:07 PM This report has been signed electronically.

## 2018-05-22 NOTE — Progress Notes (Signed)
Pt unable to pass gas or achieve level of comfort acceptable for discharge post procedure.   Abdomen remained firm.  Pt was put into multiple positions as she could tolerate.  Pt received Levsin .25 mcg   PO without change.  Also received Fentanyl 100 mcg IV in divided doses and Zofran 4 mg IV with reduction in pain from 10/10 to 6/10 at time of her discharge at 1635.  Pt was transferred to [facility] ED via [facility].

## 2018-05-22 NOTE — Patient Instructions (Addendum)
   The sigmoid colon is narrowed and not flexible - probably from diverticulosis but I am not sure and recommend a CT scan.  Take MiraLax every single day unless it is causing diarrhea.  I appreciate the opportunity to care for you. Gatha Mayer, MD, FACG   YOU HAD AN ENDOSCOPIC PROCEDURE TODAY AT [facility]:   Refer to the procedure report that was given to you for any specific questions about what was found during the examination.  If the procedure report does not answer your questions, please call your gastroenterologist to clarify.  If you requested that your care partner not be given the details of your procedure findings, then the procedure report has been included in a sealed envelope for you to review at your convenience later.  YOU SHOULD EXPECT: Some feelings of bloating in the abdomen. Passage of more gas than usual.  Walking can help get rid of the air that was put into your GI tract during the procedure and reduce the bloating. If you had a lower endoscopy (such as a colonoscopy or flexible sigmoidoscopy) you may notice spotting of blood in your stool or on the toilet paper. If you underwent a bowel prep for your procedure, you may not have a normal bowel movement for a few days.  Please Note:  You might notice some irritation and congestion in your nose or some drainage.  This is from the oxygen used during your procedure.  There is no need for concern and it should clear up in a day or so.  SYMPTOMS TO REPORT IMMEDIATELY:   Following lower endoscopy (colonoscopy or flexible sigmoidoscopy):  Excessive amounts of blood in the stool  Significant tenderness or worsening of abdominal pains  Swelling of the abdomen that is new, acute  Fever of 100F or higher    For urgent or emergent issues, a gastroenterologist can be reached at any hour by calling [phone].   DIET:  We do recommend a small meal at first, but then you may proceed to your regular  diet.  Drink plenty of fluids but you should avoid alcoholic beverages for 24 hours.  ACTIVITY:  You should plan to take it easy for the rest of today and you should NOT DRIVE or use heavy machinery until tomorrow (because of the sedation medicines used during the test).    FOLLOW UP: Our staff will call the number listed on your records the next business day following your procedure to check on you and address any questions or concerns that you may have regarding the information given to you following your procedure. If we do not reach you, we will leave a message.  However, if you are feeling well and you are not experiencing any problems, there is no need to return our call.  We will assume that you have returned to your regular daily activities without incident.  If any biopsies were taken you will be contacted by phone or by letter within the next 1-3 weeks.  Please call us at [phone] if you have not heard about the biopsies in 3 weeks.    SIGNATURES/CONFIDENTIALITY: You and/or your care partner have signed paperwork which will be entered into your electronic medical record.  These signatures attest to the fact that that the information above on your After Visit Summary has been reviewed and is understood.  Full responsibility of the confidentiality of this discharge information lies with you and/or your care-partner.

## 2018-05-22 NOTE — Progress Notes (Signed)
History reviewed today 

## 2018-05-22 NOTE — ED Triage Notes (Signed)
Patient BIB EMS from the Endoscopy center across the street for abdominal pain. Patient is s/p endoscopy and "has not been able to pass the gas." Patient diagnosed with diverticulosis at endoscopy center. Patient given 17mcg Fentanyl IV total at endoscopy center. 22G Right hand PIV started at endoscopy center.

## 2018-05-22 NOTE — Progress Notes (Signed)
A/ox3 pleased with MAC, report to RN 

## 2018-05-23 ENCOUNTER — Telehealth: Payer: Self-pay

## 2018-05-23 NOTE — Telephone Encounter (Signed)
  Follow up Call-  Call back number 05/22/2018 06/24/2017  Post procedure Call Back phone  # [phone]  Permission to leave phone message Yes Yes  Some recent data might be hidden     Patient questions:  Do you have a fever, pain , or abdominal swelling? No. Pain Score  0 *  Have you tolerated food without any problems? Yes.    Have you been able to return to your normal activities? Yes.    Do you have any questions about your discharge instructions: Diet   Yes.   Medications  No. Follow up visit  Yes.    Do you have questions or concerns about your Care? Yes.  pt had CT scan yesterday which was negative for perf. She asked about diet which I advised to do light and bland. She still has gas in lower abd.Advised to call dr. Carlean Purl if she had not heard from him in the next day or two. Advised as per report high fiber and daily miralax  Actions: * If pain score is 4 or above: No action needed, pain <4.

## 2018-05-26 ENCOUNTER — Telehealth: Payer: Self-pay | Admitting: Internal Medicine

## 2018-05-26 NOTE — Telephone Encounter (Signed)
Pt calling for ct scan results that she had done last Monday at the hospital.

## 2018-05-26 NOTE — Telephone Encounter (Signed)
Please review the CT from post procedure.

## 2018-05-26 NOTE — Telephone Encounter (Signed)
Patient notified Follow up arranged for 07/07/18

## 2018-05-26 NOTE — Telephone Encounter (Signed)
The CT scan shows diverticulosis and a narrowed portion of the colon as I saw on colonoscopy.  Hope she is feeling better.  Think this narrowed portion of the colon is the cause of her problems.  She needs to take MiraLAX 1 or 2 doses a day promote better bowel movements that are easier and will not cause pain.  Please ask her what her symptoms are now and what other question she may have.  I would like her to follow-up with me in 4 to 6 weeks she could see Anderson Malta, if that is the case let me know.  I am hoping we can treat her with laxatives like MiraLAX which are gentle but if she has persistent problems could need to consider referring to a surgeon but usually we try to avoid that unless she fails medical treatment.

## 2018-06-09 ENCOUNTER — Ambulatory Visit: Payer: Medicare Other | Admitting: Internal Medicine

## 2018-07-04 ENCOUNTER — Ambulatory Visit: Payer: Self-pay | Admitting: Surgery

## 2018-07-04 ENCOUNTER — Encounter: Payer: Self-pay | Admitting: Surgery

## 2018-07-04 DIAGNOSIS — K56699 Other intestinal obstruction unspecified as to partial versus complete obstruction: Secondary | ICD-10-CM | POA: Insufficient documentation

## 2018-07-04 NOTE — H&P (Signed)
Rebekah Schmidt Documented: 07/04/2018 11:23 AM Location: [facility] Patient #: [id number] DOB: 1951-07-28 Married / Language: English / Race: White Female  History of Present Illness Adin Hector MD; 07/04/2018 12:46 PM) The patient is a 67 year old female who presents with colonic obstruction. Note for "Colon obstruction": ` ` ` Patient sent for surgical consultation at the request of Dr Laney Pastor  Chief Complaint: Worsening bloating and constipation with sigmoid stricture ` ` The patient is a pleasant will not from [place]. Usually moves her bowels every day. However starting in the summertime she noticed worsening constipation. She's tried many things to help compensate for this. MiraLAX, Dulcolax, milk of magnesia, Colace. We will get backed up and then have the clean out. Progressively worsening. Frustrating. Had colonoscopy last month noting a stricture of the sigmoid colon. Very tight. Appeared to be smooth and benign. More likely diverticular. More aggressive bowel regimen recommended. However over the past month, she feels like things have worsened. Feeling more bloated. CAT scan showed dilated colon. This cancer metastatic disease. Primary care physician requested surgical evaluation over concerns of worsening sigmoid stricture and need for resection.  Patient had laparoscopic cholecystectomy, tubal ligation, endometriosis surgery with hysterectomy. No recent abdominal surgeries. No history of bowel obstructions. She does not smoke. She is nondiabetic. No cardiovascular issues. She can walk at least half hour without difficulty. Had an underwhelming colonoscopy in 2007. Welding Cologuard a few years ago.  (Review of systems as stated in this history (HPI) or in the review of systems. Otherwise all other 12 point ROS are negative) ` ` `   Past Surgical History Geni Bers [place], RMA; 07/04/2018 11:23  AM) Appendectomy Gallbladder Surgery - Laparoscopic Hysterectomy (not due to cancer) - Complete Oral Surgery  Diagnostic Studies History Geni Bers [employer], RMA; 07/04/2018 11:23 AM) Colonoscopy within last year Mammogram within last year  Allergies Geni Bers [place], RMA; 07/04/2018 11:24 AM) Morphine Derivatives Itching. Allergies Reconciled  Medication History [employer], RMA; 07/04/2018 11:27 AM) LORazepam (0.5MG  Tablet, Oral) Active. Zolpidem Tartrate (10MG  Tablet, Oral) Active. Hyoscyamine Sulfate (0.125MG  Tablet, Oral) Active. Pantoprazole Sodium (20MG  Tablet DR, Oral) Active. Vitamin D (Oral) Specific strength unknown - Active. Melatonin (5MG  Tablet, Oral) Active. Afrin (0.025% Solution, Nasal) Active. Cyclobenzaprine HCl (5MG  Tablet, Oral) Active. Medications Reconciled  Social History Geni Bers [place], RMA; 07/04/2018 11:23 AM) Alcohol use Occasional alcohol use. Caffeine use Carbonated beverages, Coffee, Tea. No drug use Tobacco use Never smoker.  Family History Geni Bers [place], [place]; 07/04/2018 11:23 AM) First Degree Relatives No pertinent family history  Pregnancy / Birth History Geni Bers [place], [place]; 07/04/2018 11:23 AM) Age at menarche 68 years. Age of menopause 41-50 Gravida 1 Maternal age 45-20 Para 1  Other Problems Geni Bers [place], RMA; 07/04/2018 11:23 AM) Anxiety Disorder Arthritis Back Pain Diverticulosis Gastroesophageal Reflux Disease     Review of Systems Geni Bers Hag[place] RMA; 07/04/2018 11:23 AM) General Present- Appetite Loss, Fatigue and Weight Loss. Not Present- Chills, Fever, Night Sweats and Weight Gain. Skin Not Present- Change in Wart/Mole, Dryness, Hives, Jaundice, New Lesions, Non-Healing Wounds, Rash and Ulcer. HEENT Not Present- Earache, Hearing Loss, Hoarseness, Nose Bleed, Oral Ulcers, Ringing in the Ears, Seasonal Allergies, Sinus Pain, Sore Throat, Visual  Disturbances, Wears glasses/contact lenses and Yellow Eyes. Respiratory Not Present- Bloody sputum, Chronic Cough, Difficulty Breathing, Snoring and Wheezing. Breast Not Present- Breast Mass, Breast Pain, Nipple Discharge and Skin Changes. Cardiovascular Not Present- Chest Pain, Difficulty Breathing Lying Down, Leg Cramps, Palpitations, Rapid Heart Rate, Shortness of Breath and  Swelling of Extremities. Gastrointestinal Present- Abdominal Pain, Bloating, Change in Bowel Habits, Constipation, Gets full quickly at meals and Rectal Pain. Not Present- Bloody Stool, Chronic diarrhea, Difficulty Swallowing, Excessive gas, Hemorrhoids, Indigestion, Nausea and Vomiting. Female Genitourinary Not Present- Frequency, Nocturia, Painful Urination, Pelvic Pain and Urgency. Musculoskeletal Not Present- Back Pain, Joint Pain, Joint Stiffness, Muscle Pain, Muscle Weakness and Swelling of Extremities. Neurological Not Present- Decreased Memory, Fainting, Headaches, Numbness, Seizures, Tingling, Tremor, Trouble walking and Weakness. Psychiatric Present- Anxiety. Not Present- Bipolar, Change in Sleep Pattern, Depression, Fearful and Frequent crying. Endocrine Not Present- Cold Intolerance, Excessive Hunger, Hair Changes, Heat Intolerance, Hot flashes and New Diabetes. Hematology Not Present- Blood Thinners, Easy Bruising, Excessive bleeding, Gland problems, HIV and Persistent Infections.  Vitals Geni Bers [place] RMA; 07/04/2018 11:27 AM) 07/04/2018 11:27 AM Weight: 152 lb Height: 65in Body Surface Area: 1.76 m Body Mass Index: 25.29 kg/m  Temp.: 97.4F(Temporal)  Pulse: 131 (Regular)  P.OX: 98% (Room air) BP: 142/90 (Sitting, Left Arm, Standard)      Physical Exam Adin Hector MD; 07/04/2018 11:59 AM)  General Mental Status-Alert. General Appearance-Not in acute distress, Not Sickly. Orientation-Oriented X3. Hydration-Well hydrated. Voice-Normal.  Integumentary Global  Assessment Upon inspection and palpation of skin surfaces of the - Axillae: non-tender, no inflammation or ulceration, no drainage. and Distribution of scalp and body hair is normal. General Characteristics Temperature - normal warmth is noted.  Head and Neck Head-normocephalic, atraumatic with no lesions or palpable masses. Face Global Assessment - atraumatic, no absence of expression. Neck Global Assessment - no abnormal movements, no bruit auscultated on the right, no bruit auscultated on the left, no decreased range of motion, non-tender. Trachea-midline. Thyroid Gland Characteristics - non-tender.  Eye Eyeball - Left-Extraocular movements intact, No Nystagmus. Eyeball - Right-Extraocular movements intact, No Nystagmus. Cornea - Left-No Hazy. Cornea - Right-No Hazy. Sclera/Conjunctiva - Left-No scleral icterus, No Discharge. Sclera/Conjunctiva - Right-No scleral icterus, No Discharge. Pupil - Left-Direct reaction to light normal. Pupil - Right-Direct reaction to light normal. Note: Wears glasses. Vision corrected  ENMT Ears Pinna - Left - no drainage observed, no generalized tenderness observed. Right - no drainage observed, no generalized tenderness observed. Nose and Sinuses External Inspection of the Nose - no destructive lesion observed. Inspection of the nares - Left - quiet respiration. Right - quiet respiration. Mouth and Throat Lips - Upper Lip - no fissures observed, no pallor noted. Lower Lip - no fissures observed, no pallor noted. Nasopharynx - no discharge present. Oral Cavity/Oropharynx - Tongue - no dryness observed. Oral Mucosa - no cyanosis observed. Hypopharynx - no evidence of airway distress observed.  Chest and Lung Exam Inspection Movements - Normal and Symmetrical. Accessory muscles - No use of accessory muscles in breathing. Palpation Palpation of the chest reveals - Non-tender. Auscultation Breath sounds - Normal and  Clear.  Cardiovascular Auscultation Rhythm - Regular. Murmurs & Other Heart Sounds - Auscultation of the heart reveals - No Murmurs and No Systolic Clicks.  Abdomen Inspection Inspection of the abdomen reveals - No Visible peristalsis and No Abnormal pulsations. Umbilicus - No Bleeding, No Urine drainage. Palpation/Percussion Palpation and Percussion of the abdomen reveal - Soft, Non Tender, No Rebound tenderness, No Rigidity (guarding) and No Cutaneous hyperesthesia. Note: Abdomen soft. Not distended. No distasis recti. No umbilical or other anterior abdominal wall hernias. Small laparoscopic incisions. Nontender.  Female Genitourinary Sexual Maturity Tanner 5 - Adult hair pattern. Note: No vaginal bleeding nor discharge  Peripheral Vascular Upper Extremity Inspection - Left -  No Cyanotic nailbeds, Not Ischemic. Right - No Cyanotic nailbeds, Not Ischemic.  Neurologic Neurologic evaluation reveals -normal attention span and ability to concentrate, able to name objects and repeat phrases. Appropriate fund of knowledge , normal sensation and normal coordination. Mental Status Affect - not angry, not paranoid. Cranial Nerves-Normal Bilaterally. Gait-Normal.  Neuropsychiatric Mental status exam performed with findings of-able to articulate well with normal speech/language, rate, volume and coherence, thought content normal with ability to perform basic computations and apply abstract reasoning and no evidence of hallucinations, delusions, obsessions or homicidal/suicidal ideation.  Musculoskeletal Global Assessment Spine, Ribs and Pelvis - no instability, subluxation or laxity. Right Upper Extremity - no instability, subluxation or laxity.  Lymphatic Head & Neck  General Head & Neck Lymphatics: Bilateral - Description - No Localized lymphadenopathy. Axillary  General Axillary Region: Bilateral - Description - No Localized lymphadenopathy. Femoral &  Inguinal  Generalized Femoral & Inguinal Lymphatics: Left - Description - No Localized lymphadenopathy. Right - Description - No Localized lymphadenopathy.    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 Struggling with bloating and severe constipation despite more aggressive bowel regimen.  She is miserable and wishes to have surgery to remove the problem area. I think it is reasonable consider that. Good candidate for a minimally invasive approach.  I recommend she switched to a. Blenderized diet for the next several days to help get her bowels are to control. Do MiraLAX half dose twice a day to more gently but persistently keep her from getting backed up. Then retry more solid food as tolerated.  She's had some unintentional weight loss of about 10 pounds. She is worried she's not eating enough. She is interested and supplemental shakes. I think that is a good idea. Ensure or boost. 2 shakes a day to help out as well. She does not look frankly cachectic and there is no evidence of any cancer, sulfa just some mild malnutrition at this point.  We will work to try and find a convenient time for sigmoid colectomy to remove the stricture with worsening symptoms.  Current Plans You are being scheduled for surgery- Our schedulers will call you.  You should hear from our office's scheduling department within 5 working days about the location, date, and time of surgery. We try to make accommodations for patient's preferences in scheduling surgery, but sometimes the OR schedule or the surgeon's schedule prevents [place] from making those accommodations.  If you have not heard from our office [phone]) in 5 working days, call the office and ask for your surgeon's nurse.  If you have other questions about your diagnosis, plan, or surgery, call the office and  ask for your surgeon's nurse.  Pt Education - [facility] Free Text Education/Instructions: discussed with patient and provided information. Pt Education - [facility] Esophageal Surgery Diet HCI (Jacquie Lukes): discussed with patient and provided information.  PREOP COLON - ENCOUNTER FOR PREOPERATIVE EXAMINATION FOR GENERAL SURGICAL PROCEDURE (B93.903)  Current Plans Written instructions provided The anatomy & physiology of the digestive tract was discussed. The pathophysiology of the colon was discussed. Natural history risks without surgery was discussed. I feel the risks of no intervention will lead to serious problems that outweigh the operative risks; therefore, I recommended a partial colectomy to remove the pathology. Minimally invasive (Robotic/Laparoscopic) & open techniques were discussed.  Risks such as bleeding, infection, abscess, leak, reoperation, possible ostomy, hernia, heart attack, death,  and other risks were discussed. I noted a good likelihood this will help address the problem. Goals of post-operative recovery were discussed as well. Need for adequate nutrition, daily bowel regimen and healthy physical activity, to optimize recovery was noted as well. We will work to minimize complications. Educational materials were available as well. Questions were answered. The patient expresses understanding & wishes to proceed with surgery.  Pt Education - [facility] Colon Bowel Prep 2018 ERAS/Miralax/Antibiotics Started Neomycin Sulfate 500 MG Oral Tablet, 2 (two) Tablet SEE NOTE, #6, 07/04/2018, No Refill. Local Order: TAKE TWO TABLETS AT 2 PM, 3 PM, AND 10 PM THE DAY PRIOR TO SURGERY Started Flagyl 500 MG Oral Tablet, 2 (two) Tablet SEE NOTE, #6, 07/04/2018, No Refill. Local Order: Take at 2pm, 3pm, and 10pm the day prior to your colon operation Pt Education - Pamphlet Given - Laparoscopic Colorectal Surgery: discussed with patient and provided information. Pt Education - [facility] Colectomy post-op  instructions: discussed with patient and provided information.  Adin Hector, MD, FACS, MASCRS Gastrointestinal and Minimally Invasive Surgery    [address] [place], [place] [address] [phone] Main / Paging [phone] Fax

## 2018-07-07 ENCOUNTER — Ambulatory Visit: Payer: Medicare Other | Admitting: Internal Medicine

## 2018-08-16 HISTORY — PX: COLON RESECTION: SHX5231

## 2018-08-23 NOTE — Patient Instructions (Signed)
Rebekah Schmidt  08/23/2018   Your procedure is scheduled on: 09-06-08   Report to [facility] Main  Entrance             Report to admitting at      1000 AM    Call this number if you have problems the morning of surgery [phone]    Remember: follow bowel prep per MD orders follow a clear liquid diet the day of your bowel prep to prevent dehydration    CLEAR LIQUID DIET   Foods Allowed                                                                     Foods Excluded  Coffee and tea, regular and decaf                             liquids that you cannot  Plain Jell-O in any flavor                                             see through such as: Fruit ices (not with fruit pulp)                                     milk, soups, orange juice  Iced Popsicles                                    All solid food Carbonated beverages, regular and diet                                    Cranberry, grape and apple juices Sports drinks like Gatorade Lightly seasoned clear broth or consume(fat free) Sugar, honey syrup  Sample Menu Breakfast                                Lunch                                     Supper Cranberry juice                    Beef broth                            Chicken broth Jell-O                                     Grape juice  Apple juice Coffee or tea                        Jell-O                                      Popsicle                                                Coffee or tea                        Coffee or tea  _____________________________________________________________________  DRINK 2 PRESURGERY ENSURE DRINKS THE NIGHT BEFORE SURGERY AT  1000 PM AND 1 PRESURGERY DRINK THE DAY OF THE PROCEDURE 3 HOURS PRIOR TO SCHEDULED SURGERY. NO SOLIDS AFTER MIDNIGHT THE DAY PRIOR TO THE SURGERY. NOTHING BY MOUTH EXCEPT CLEAR LIQUIDS UNTIL THREE HOURS PRIOR TO SCHEDULED SURGERY. PLEASE FINISH PRESURGERY ENSURE  DRINK PER SURGEON ORDER 3 HOURS PRIOR TO SCHEDULED SURGERY TIME WHICH NEEDS TO BE COMPLETED AT ___0900 am then nothing by mouth______.    BRUSH YOUR TEETH MORNING OF SURGERY AND RINSE YOUR MOUTH OUT, NO CHEWING GUM CANDY OR MINTS.     Take these medicines the morning of surgery with A SIP OF WATER: hyoscyamine, ativan if needed                                You may not have any metal on your body including hair pins and              piercings  Do not wear jewelry, make-up, lotions, powders or perfumes, deodorant             Do not wear nail polish.  Do not shave  48 hours prior to surgery.            Do not bring valuables to the hospital. [place].  Contacts, dentures or bridgework may not be worn into surgery.  Leave suitcase in the car. After surgery it may be brought to your room.   Special Instructions: N/A              Please read over the following fact sheets you were given: _____________________________________________________________________          [facility] - Preparing for Surgery Before surgery, you can play an important role.  Because skin is not sterile, your skin needs to be as free of germs as possible.  You can reduce the number of germs on your skin by washing with CHG (chlorahexidine gluconate) soap before surgery.  CHG is an antiseptic cleaner which kills germs and bonds with the skin to continue killing germs even after washing. Please DO NOT use if you have an allergy to CHG or antibacterial soaps.  If your skin becomes reddened/irritated stop using the CHG and inform your nurse when you arrive at Short Stay. Do not shave (including legs and underarms) for at least 48 hours prior to the first CHG shower.  You may shave your face/neck. Please follow these instructions carefully:  1.  Shower with CHG Soap the night before surgery and the  morning of Surgery.  2.  If you choose to wash your hair, wash your hair  first as usual with your  normal  shampoo.  3.  After you shampoo, rinse your hair and body thoroughly to remove the  shampoo.                           4.  Use CHG as you would any other liquid soap.  You can apply chg directly  to the skin and wash                       Gently with a scrungie or clean washcloth.  5.  Apply the CHG Soap to your body ONLY FROM THE NECK DOWN.   Do not use on face/ open                           Wound or open sores. Avoid contact with eyes, ears mouth and genitals (private parts).                       Wash face,  Genitals (private parts) with your normal soap.             6.  Wash thoroughly, paying special attention to the area where your surgery  will be performed.  7.  Thoroughly rinse your body with warm water from the neck down.  8.  DO NOT shower/wash with your normal soap after using and rinsing off  the CHG Soap.                9.  Pat yourself dry with a clean towel.            10.  Wear clean pajamas.            11.  Place clean sheets on your bed the night of your first shower and do not  sleep with pets. Day of Surgery : Do not apply any lotions/deodorants the morning of surgery.  Please wear clean clothes to the hospital/surgery center.  FAILURE TO FOLLOW THESE INSTRUCTIONS MAY RESULT IN THE CANCELLATION OF YOUR SURGERY PATIENT SIGNATURE_________________________________  NURSE SIGNATURE__________________________________  ________________________________________________________________________  WHAT IS A BLOOD TRANSFUSION? Blood Transfusion Information  A transfusion is the replacement of blood or some of its parts. Blood is made up of multiple cells which provide different functions.  Red blood cells carry oxygen and are used for blood loss replacement.  White blood cells fight against infection.  Platelets control bleeding.  Plasma helps clot blood.  Other blood products are available for specialized needs, such as hemophilia or other  clotting disorders. BEFORE THE TRANSFUSION  Who gives blood for transfusions?   Healthy volunteers who are fully evaluated to make sure their blood is safe. This is blood bank blood. Transfusion therapy is the safest it has ever been in the practice of medicine. Before blood is taken from a donor, a complete history is taken to make sure that person has no history of diseases nor engages in risky social behavior (examples are intravenous drug use or sexual activity with multiple partners). The donor's travel history is screened to minimize risk of transmitting infections, such as malaria. The donated blood is tested for signs of infectious diseases, such as HIV and hepatitis. The blood is  then tested to be sure it is compatible with you in order to minimize the chance of a transfusion reaction. If you or a relative donates blood, this is often done in anticipation of surgery and is not appropriate for emergency situations. It takes many days to process the donated blood. RISKS AND COMPLICATIONS Although transfusion therapy is very safe and saves many lives, the main dangers of transfusion include:   Getting an infectious disease.  Developing a transfusion reaction. This is an allergic reaction to something in the blood you were given. Every precaution is taken to prevent this. The decision to have a blood transfusion has been considered carefully by your caregiver before blood is given. Blood is not given unless the benefits outweigh the risks. AFTER THE TRANSFUSION  Right after receiving a blood transfusion, you will usually feel much better and more energetic. This is especially true if your red blood cells have gotten low (anemic). The transfusion raises the level of the red blood cells which carry oxygen, and this usually causes an energy increase.  The nurse administering the transfusion will monitor you carefully for complications. HOME CARE INSTRUCTIONS  No special instructions are needed  after a transfusion. You may find your energy is better. Speak with your caregiver about any limitations on activity for underlying diseases you may have. SEEK MEDICAL CARE IF:   Your condition is not improving after your transfusion.  You develop redness or irritation at the intravenous (IV) site. SEEK IMMEDIATE MEDICAL CARE IF:  Any of the following symptoms occur over the next 12 hours:  Shaking chills.  You have a temperature by mouth above 102 F (38.9 C), not controlled by medicine.  Chest, back, or muscle pain.  People around you feel you are not acting correctly or are confused.  Shortness of breath or difficulty breathing.  Dizziness and fainting.  You get a rash or develop hives.  You have a decrease in urine output.  Your urine turns a dark color or changes to pink, red, or brown. Any of the following symptoms occur over the next 10 days:  You have a temperature by mouth above 102 F (38.9 C), not controlled by medicine.  Shortness of breath.  Weakness after normal activity.  The white part of the eye turns yellow (jaundice).  You have a decrease in the amount of urine or are urinating less often.  Your urine turns a dark color or changes to pink, red, or brown. Document Released: 07/30/2000 Document Revised: 10/25/2011 Document Reviewed: 03/18/2008 ExitCare Patient Information 2014 [place].  _______________________________________________________________________  Incentive Spirometer  An incentive spirometer is a tool that can help keep your lungs clear and active. This tool measures how well you are filling your lungs with each breath. Taking long deep breaths may help reverse or decrease the chance of developing breathing (pulmonary) problems (especially infection) following:  A long period of time when you are unable to move or be active. BEFORE THE PROCEDURE   If the spirometer includes an indicator to show your best effort, your nurse or  respiratory therapist will set it to a desired goal.  If possible, sit up straight or lean slightly forward. Try not to slouch.  Hold the incentive spirometer in an upright position. INSTRUCTIONS FOR USE  1. Sit on the edge of your bed if possible, or sit up as far as you can in bed or on a chair. 2. Hold the incentive spirometer in an upright position. 3. Breathe out normally.  4. Place the mouthpiece in your mouth and seal your lips tightly around it. 5. Breathe in slowly and as deeply as possible, raising the piston or the ball toward the top of the column. 6. Hold your breath for 3-5 seconds or for as long as possible. Allow the piston or ball to fall to the bottom of the column. 7. Remove the mouthpiece from your mouth and breathe out normally. 8. Rest for a few seconds and repeat Steps 1 through 7 at least 10 times every 1-2 hours when you are awake. Take your time and take a few normal breaths between deep breaths. 9. The spirometer may include an indicator to show your best effort. Use the indicator as a goal to work toward during each repetition. 10. After each set of 10 deep breaths, practice coughing to be sure your lungs are clear. If you have an incision (the cut made at the time of surgery), support your incision when coughing by placing a pillow or rolled up towels firmly against it. Once you are able to get out of bed, walk around indoors and cough well. You may stop using the incentive spirometer when instructed by your caregiver.  RISKS AND COMPLICATIONS  Take your time so you do not get dizzy or light-headed.  If you are in pain, you may need to take or ask for pain medication before doing incentive spirometry. It is harder to take a deep breath if you are having pain. AFTER USE  Rest and breathe slowly and easily.  It can be helpful to keep track of a log of your progress. Your caregiver can provide you with a simple table to help with this. If you are using the  spirometer at home, follow these instructions: [place] IF:   You are having difficultly using the spirometer.  You have trouble using the spirometer as often as instructed.  Your pain medication is not giving enough relief while using the spirometer.  You develop fever of 100.5 F (38.1 C) or higher. SEEK IMMEDIATE MEDICAL CARE IF:   You cough up bloody sputum that had not been present before.  You develop fever of 102 F (38.9 C) or greater.  You develop worsening pain at or near the incision site. MAKE SURE YOU:   Understand these instructions.  Will watch your condition.  Will get help right away if you are not doing well or get worse. Document Released: 12/13/2006 Document Revised: 10/25/2011 Document Reviewed: 02/13/2007 [facility] Patient Information 2014 [place], [place].   ________________________________________________________________________

## 2018-08-29 ENCOUNTER — Inpatient Hospital Stay (HOSPITAL_COMMUNITY)
Admission: RE | Admit: 2018-08-29 | Discharge: 2018-08-29 | Disposition: A | Discharge status: Home / Self Care | Payer: Medicare Other | Source: Ambulatory Visit

## 2018-08-29 NOTE — Patient Instructions (Signed)
GAYL IVANOFF  08/29/2018   Your procedure is scheduled on: 09-06-18  Report to [facility] Main  Entrance                   Report to admitting at       1000  AM    Call this number if you have problems the morning of surgery [phone]    Remember:  Follow a clear liquid diet the day of your bowel prep .... Follow bowel prep per MD    CLEAR LIQUID DIET   Foods Allowed                                                                     Foods Excluded  Coffee and tea, regular and decaf                             liquids that you cannot  Plain Jell-O in any flavor                                             see through such as: Fruit ices (not with fruit pulp)                                     milk, soups, orange juice  Iced Popsicles                                    All solid food Carbonated beverages, regular and diet                                    Cranberry, grape and apple juices Sports drinks like Gatorade Lightly seasoned clear broth or consume(fat free) Sugar, honey syrup  Sample Menu Breakfast                                Lunch                                     Supper Cranberry juice                    Beef broth                            Chicken broth Jell-O                                     Grape juice  Apple juice Coffee or tea                        Jell-O                                      Popsicle                                                Coffee or tea                        Coffee or tea  _____________________________________________________________________     DRINK 2 PRESURGERY ENSURE DRINKS THE NIGHT BEFORE SURGERY AT  1000 PM AND 1 PRESURGERY DRINK THE DAY OF THE PROCEDURE 3 HOURS PRIOR TO SCHEDULED SURGERY. NO SOLIDS AFTER MIDNIGHT THE DAY PRIOR TO THE SURGERY. NOTHING BY MOUTH EXCEPT CLEAR LIQUIDS UNTIL THREE HOURS PRIOR TO SCHEDULED SURGERY. PLEASE FINISH PRESURGERY ENSURE DRINK PER  SURGEON ORDER 3 HOURS PRIOR TO SCHEDULED SURGERY TIME WHICH NEEDS TO BE COMPLETED AT _0900 am  Then nothing by mouth________.   BRUSH YOUR TEETH MORNING OF SURGERY AND RINSE YOUR MOUTH OUT, NO CHEWING GUM CANDY OR MINTS.     Take these medicines the morning of surgery with A SIP OF WATER: hycosamine, ativan if needed                                You may not have any metal on your body including hair pins and              piercings  Do not wear jewelry, make-up, lotions, powders or perfumes, deodorant             Do not wear nail polish.  Do not shave  48 hours prior to surgery.     Do not bring valuables to the hospital. [place].  Contacts, dentures or bridgework may not be worn into surgery.  Leave suitcase in the car. After surgery it may be brought to your room.    Special Instructions: N/A              Please read over the following fact sheets you were given: _____________________________________________________________________          [facility] - Preparing for Surgery Before surgery, you can play an important role.  Because skin is not sterile, your skin needs to be as free of germs as possible.  You can reduce the number of germs on your skin by washing with CHG (chlorahexidine gluconate) soap before surgery.  CHG is an antiseptic cleaner which kills germs and bonds with the skin to continue killing germs even after washing. Please DO NOT use if you have an allergy to CHG or antibacterial soaps.  If your skin becomes reddened/irritated stop using the CHG and inform your nurse when you arrive at Short Stay. Do not shave (including legs and underarms) for at least 48 hours prior to the first CHG shower.  You may shave your face/neck. Please follow these instructions carefully:  1.  Shower  with CHG Soap the night before surgery and the  morning of Surgery.  2.  If you choose to wash your hair, wash your hair first as usual with  your  normal  shampoo.  3.  After you shampoo, rinse your hair and body thoroughly to remove the  shampoo.                           4.  Use CHG as you would any other liquid soap.  You can apply chg directly  to the skin and wash                       Gently with a scrungie or clean washcloth.  5.  Apply the CHG Soap to your body ONLY FROM THE NECK DOWN.   Do not use on face/ open                           Wound or open sores. Avoid contact with eyes, ears mouth and genitals (private parts).                       Wash face,  Genitals (private parts) with your normal soap.             6.  Wash thoroughly, paying special attention to the area where your surgery  will be performed.  7.  Thoroughly rinse your body with warm water from the neck down.  8.  DO NOT shower/wash with your normal soap after using and rinsing off  the CHG Soap.                9.  Pat yourself dry with a clean towel.            10.  Wear clean pajamas.            11.  Place clean sheets on your bed the night of your first shower and do not  sleep with pets. Day of Surgery : Do not apply any lotions/deodorants the morning of surgery.  Please wear clean clothes to the hospital/surgery center.  FAILURE TO FOLLOW THESE INSTRUCTIONS MAY RESULT IN THE CANCELLATION OF YOUR SURGERY PATIENT SIGNATURE_________________________________  NURSE SIGNATURE__________________________________  ________________________________________________________________________  WHAT IS A BLOOD TRANSFUSION? Blood Transfusion Information  A transfusion is the replacement of blood or some of its parts. Blood is made up of multiple cells which provide different functions.  Red blood cells carry oxygen and are used for blood loss replacement.  White blood cells fight against infection.  Platelets control bleeding.  Plasma helps clot blood.  Other blood products are available for specialized needs, such as hemophilia or other clotting  disorders. BEFORE THE TRANSFUSION  Who gives blood for transfusions?   Healthy volunteers who are fully evaluated to make sure their blood is safe. This is blood bank blood. Transfusion therapy is the safest it has ever been in the practice of medicine. Before blood is taken from a donor, a complete history is taken to make sure that person has no history of diseases nor engages in risky social behavior (examples are intravenous drug use or sexual activity with multiple partners). The donor's travel history is screened to minimize risk of transmitting infections, such as malaria. The donated blood is tested for signs of infectious diseases, such as HIV and hepatitis. The blood is then tested to  be sure it is compatible with you in order to minimize the chance of a transfusion reaction. If you or a relative donates blood, this is often done in anticipation of surgery and is not appropriate for emergency situations. It takes many days to process the donated blood. RISKS AND COMPLICATIONS Although transfusion therapy is very safe and saves many lives, the main dangers of transfusion include:   Getting an infectious disease.  Developing a transfusion reaction. This is an allergic reaction to something in the blood you were given. Every precaution is taken to prevent this. The decision to have a blood transfusion has been considered carefully by your caregiver before blood is given. Blood is not given unless the benefits outweigh the risks. AFTER THE TRANSFUSION  Right after receiving a blood transfusion, you will usually feel much better and more energetic. This is especially true if your red blood cells have gotten low (anemic). The transfusion raises the level of the red blood cells which carry oxygen, and this usually causes an energy increase.  The nurse administering the transfusion will monitor you carefully for complications. HOME CARE INSTRUCTIONS  No special instructions are needed after a  transfusion. You may find your energy is better. Speak with your caregiver about any limitations on activity for underlying diseases you may have. SEEK MEDICAL CARE IF:   Your condition is not improving after your transfusion.  You develop redness or irritation at the intravenous (IV) site. SEEK IMMEDIATE MEDICAL CARE IF:  Any of the following symptoms occur over the next 12 hours:  Shaking chills.  You have a temperature by mouth above 102 F (38.9 C), not controlled by medicine.  Chest, back, or muscle pain.  People around you feel you are not acting correctly or are confused.  Shortness of breath or difficulty breathing.  Dizziness and fainting.  You get a rash or develop hives.  You have a decrease in urine output.  Your urine turns a dark color or changes to pink, red, or brown. Any of the following symptoms occur over the next 10 days:  You have a temperature by mouth above 102 F (38.9 C), not controlled by medicine.  Shortness of breath.  Weakness after normal activity.  The white part of the eye turns yellow (jaundice).  You have a decrease in the amount of urine or are urinating less often.  Your urine turns a dark color or changes to pink, red, or brown. Document Released: 07/30/2000 Document Revised: 10/25/2011 Document Reviewed: 03/18/2008 [employer] Patient Information 2014 [place].  _______________________________________________________________________  Incentive Spirometer  An incentive spirometer is a tool that can help keep your lungs clear and active. This tool measures how well you are filling your lungs with each breath. Taking long deep breaths may help reverse or decrease the chance of developing breathing (pulmonary) problems (especially infection) following:  A long period of time when you are unable to move or be active. BEFORE THE PROCEDURE   If the spirometer includes an indicator to show your best effort, your nurse or  respiratory therapist will set it to a desired goal.  If possible, sit up straight or lean slightly forward. Try not to slouch.  Hold the incentive spirometer in an upright position. INSTRUCTIONS FOR USE  1. Sit on the edge of your bed if possible, or sit up as far as you can in bed or on a chair. 2. Hold the incentive spirometer in an upright position. 3. Breathe out normally. 4. Place the  mouthpiece in your mouth and seal your lips tightly around it. 5. Breathe in slowly and as deeply as possible, raising the piston or the ball toward the top of the column. 6. Hold your breath for 3-5 seconds or for as long as possible. Allow the piston or ball to fall to the bottom of the column. 7. Remove the mouthpiece from your mouth and breathe out normally. 8. Rest for a few seconds and repeat Steps 1 through 7 at least 10 times every 1-2 hours when you are awake. Take your time and take a few normal breaths between deep breaths. 9. The spirometer may include an indicator to show your best effort. Use the indicator as a goal to work toward during each repetition. 10. After each set of 10 deep breaths, practice coughing to be sure your lungs are clear. If you have an incision (the cut made at the time of surgery), support your incision when coughing by placing a pillow or rolled up towels firmly against it. Once you are able to get out of bed, walk around indoors and cough well. You may stop using the incentive spirometer when instructed by your caregiver.  RISKS AND COMPLICATIONS  Take your time so you do not get dizzy or light-headed.  If you are in pain, you may need to take or ask for pain medication before doing incentive spirometry. It is harder to take a deep breath if you are having pain. AFTER USE  Rest and breathe slowly and easily.  It can be helpful to keep track of a log of your progress. Your caregiver can provide you with a simple table to help with this. If you are using the  spirometer at home, follow these instructions: [place] IF:   You are having difficultly using the spirometer.  You have trouble using the spirometer as often as instructed.  Your pain medication is not giving enough relief while using the spirometer.  You develop fever of 100.5 F (38.1 C) or higher. SEEK IMMEDIATE MEDICAL CARE IF:   You cough up bloody sputum that had not been present before.  You develop fever of 102 F (38.9 C) or greater.  You develop worsening pain at or near the incision site. MAKE SURE YOU:   Understand these instructions.  Will watch your condition.  Will get help right away if you are not doing well or get worse. Document Released: 12/13/2006 Document Revised: 10/25/2011 Document Reviewed: 02/13/2007 [facility] Patient Information 2014 [place], [place].   ________________________________________________________________________

## 2018-08-30 ENCOUNTER — Other Ambulatory Visit: Payer: Self-pay

## 2018-08-30 ENCOUNTER — Encounter (HOSPITAL_COMMUNITY)
Admission: RE | Admit: 2018-08-30 | Discharge: 2018-08-30 | Disposition: A | Discharge status: Home / Self Care | Payer: Medicare Other | Source: Ambulatory Visit | Attending: Surgery | Admitting: Surgery

## 2018-08-30 ENCOUNTER — Encounter (HOSPITAL_COMMUNITY): Payer: Self-pay

## 2018-08-30 DIAGNOSIS — Z01812 Encounter for preprocedural laboratory examination: Secondary | ICD-10-CM | POA: Diagnosis not present

## 2018-08-30 HISTORY — DX: Personal history of urinary calculi: Z87.442

## 2018-08-30 HISTORY — DX: Pneumonia, unspecified organism: J18.9

## 2018-08-30 LAB — BASIC METABOLIC PANEL WITH GFR
Anion gap: 9 (ref 5–15)
BUN: 10 mg/dL (ref 8–23)
CO2: 25 mmol/L (ref 22–32)
Calcium: 9.2 mg/dL (ref 8.9–10.3)
Chloride: 108 mmol/L (ref 98–111)
Creatinine, Ser: 0.71 mg/dL (ref 0.44–1.00)
GFR calc Af Amer: >60 mL/min
GFR calc non Af Amer: >60 mL/min
Glucose, Bld: 83 mg/dL (ref 70–99)
Potassium: 4.4 mmol/L (ref 3.5–5.1)
Sodium: 142 mmol/L (ref 135–145)

## 2018-08-30 LAB — HEMOGLOBIN A1C
Hgb A1c MFr Bld: 5.9 % — ABNORMAL HIGH (ref 4.8–5.6)
Mean Plasma Glucose: 122.63 mg/dL

## 2018-08-30 LAB — CBC
HCT: 41.5 % (ref 36.0–46.0)
Hemoglobin: 12.7 g/dL (ref 12.0–15.0)
MCH: 28.5 pg (ref 26.0–34.0)
MCHC: 30.6 g/dL (ref 30.0–36.0)
MCV: 93 fL (ref 80.0–100.0)
Platelets: 275 10*3/uL (ref 150–400)
RBC: 4.46 MIL/uL (ref 3.87–5.11)
RDW: 12.4 % (ref 11.5–15.5)
WBC: 10.3 10*3/uL (ref 4.0–10.5)
nRBC: 0 % (ref 0.0–0.2)

## 2018-08-30 LAB — ABO/RH: ABO/RH(D): A POS

## 2018-09-05 MED ORDER — BUPIVACAINE LIPOSOME 1.3 % IJ SUSP
20.0000 mL | INTRAMUSCULAR | Status: DC
  Filled 2018-09-05: qty 20

## 2018-09-05 MED ORDER — SODIUM CHLORIDE 0.9 % IV SOLN
INTRAVENOUS | Status: DC
  Filled 2018-09-05: qty 6

## 2018-09-06 ENCOUNTER — Encounter (HOSPITAL_COMMUNITY)
Admission: RE | Disposition: A | Discharge status: Home / Self Care | Payer: Self-pay | Source: Home / Self Care | Attending: Surgery

## 2018-09-06 ENCOUNTER — Other Ambulatory Visit: Payer: Self-pay

## 2018-09-06 ENCOUNTER — Inpatient Hospital Stay (HOSPITAL_COMMUNITY): Payer: Medicare Other | Admitting: Physician Assistant

## 2018-09-06 ENCOUNTER — Inpatient Hospital Stay (HOSPITAL_COMMUNITY): Payer: Medicare Other | Admitting: Certified Registered Nurse Anesthetist

## 2018-09-06 ENCOUNTER — Encounter (HOSPITAL_COMMUNITY): Payer: Self-pay | Admitting: General Practice

## 2018-09-06 ENCOUNTER — Inpatient Hospital Stay (HOSPITAL_COMMUNITY)
Admission: RE | Admit: 2018-09-06 | Discharge: 2018-09-08 | DRG: 331 | Disposition: A | Discharge status: Home / Self Care | Payer: Medicare Other | Attending: Surgery | Admitting: Surgery

## 2018-09-06 DIAGNOSIS — F419 Anxiety disorder, unspecified: Secondary | ICD-10-CM | POA: Diagnosis present

## 2018-09-06 DIAGNOSIS — Z90710 Acquired absence of both cervix and uterus: Secondary | ICD-10-CM

## 2018-09-06 DIAGNOSIS — Z885 Allergy status to narcotic agent status: Secondary | ICD-10-CM | POA: Diagnosis not present

## 2018-09-06 DIAGNOSIS — M810 Age-related osteoporosis without current pathological fracture: Secondary | ICD-10-CM | POA: Diagnosis present

## 2018-09-06 DIAGNOSIS — G4700 Insomnia, unspecified: Secondary | ICD-10-CM | POA: Diagnosis present

## 2018-09-06 DIAGNOSIS — Z9049 Acquired absence of other specified parts of digestive tract: Secondary | ICD-10-CM

## 2018-09-06 DIAGNOSIS — K56699 Other intestinal obstruction unspecified as to partial versus complete obstruction: Secondary | ICD-10-CM | POA: Diagnosis present

## 2018-09-06 DIAGNOSIS — J309 Allergic rhinitis, unspecified: Secondary | ICD-10-CM | POA: Diagnosis present

## 2018-09-06 DIAGNOSIS — I341 Nonrheumatic mitral (valve) prolapse: Secondary | ICD-10-CM | POA: Diagnosis present

## 2018-09-06 DIAGNOSIS — K660 Peritoneal adhesions (postprocedural) (postinfection): Secondary | ICD-10-CM | POA: Diagnosis present

## 2018-09-06 HISTORY — DX: Lesion of plantar nerve, right lower limb: G57.61

## 2018-09-06 HISTORY — DX: Cervicalgia: M54.2

## 2018-09-06 HISTORY — DX: Other acquired deformities of right foot: M21.6X1

## 2018-09-06 HISTORY — PX: PROCTOSCOPY: SHX2266

## 2018-09-06 LAB — TYPE AND SCREEN
ABO/RH(D): A POS
Antibody Screen: NEGATIVE

## 2018-09-06 SURGERY — COLECTOMY, PARTIAL, ROBOT-ASSISTED, LAPAROSCOPIC
Anesthesia: General

## 2018-09-06 MED ORDER — BUPIVACAINE-EPINEPHRINE (PF) 0.25% -1:200000 IJ SOLN
INTRAMUSCULAR | Status: DC | PRN
  Administered 2018-09-06: 20 mL

## 2018-09-06 MED ORDER — PANTOPRAZOLE SODIUM 20 MG PO TBEC
20.0000 mg | DELAYED_RELEASE_TABLET | Freq: Every day | ORAL | Status: DC
  Administered 2018-09-06 – 2018-09-07 (×2): 20 mg via ORAL
  Filled 2018-09-06 (×2): qty 1

## 2018-09-06 MED ORDER — BUPIVACAINE-EPINEPHRINE (PF) 0.25% -1:200000 IJ SOLN
INTRAMUSCULAR | Status: AC
  Filled 2018-09-06: qty 30

## 2018-09-06 MED ORDER — MEPERIDINE HCL 50 MG/ML IJ SOLN
INTRAMUSCULAR | Status: AC
  Administered 2018-09-06: 6.25 mg via INTRAVENOUS
  Filled 2018-09-06: qty 1

## 2018-09-06 MED ORDER — GABAPENTIN 300 MG PO CAPS
300.0000 mg | ORAL_CAPSULE | Freq: Two times a day (BID) | ORAL | Status: DC
  Administered 2018-09-06 – 2018-09-08 (×4): 300 mg via ORAL
  Filled 2018-09-06 (×4): qty 1

## 2018-09-06 MED ORDER — LIP MEDEX EX OINT
1.0000 "application " | TOPICAL_OINTMENT | Freq: Two times a day (BID) | CUTANEOUS | Status: DC
  Administered 2018-09-06 – 2018-09-08 (×3): 1 via TOPICAL
  Filled 2018-09-06 (×2): qty 7

## 2018-09-06 MED ORDER — ROCURONIUM BROMIDE 50 MG/5ML IV SOSY
PREFILLED_SYRINGE | INTRAVENOUS | Status: DC | PRN
  Administered 2018-09-06: 50 mg via INTRAVENOUS
  Administered 2018-09-06: 10 mg via INTRAVENOUS

## 2018-09-06 MED ORDER — DIPHENHYDRAMINE HCL 50 MG/ML IJ SOLN: 12.5000 mg | Freq: Four times a day (QID) | INTRAMUSCULAR | Status: DC | PRN

## 2018-09-06 MED ORDER — PHENYLEPHRINE 40 MCG/ML (10ML) SYRINGE FOR IV PUSH (FOR BLOOD PRESSURE SUPPORT)
PREFILLED_SYRINGE | INTRAVENOUS | Status: DC | PRN
  Administered 2018-09-06: 40 ug via INTRAVENOUS
  Administered 2018-09-06: 80 ug via INTRAVENOUS

## 2018-09-06 MED ORDER — ALVIMOPAN 12 MG PO CAPS
12.0000 mg | ORAL_CAPSULE | ORAL | Status: AC
  Administered 2018-09-06: 12 mg via ORAL
  Filled 2018-09-06: qty 1

## 2018-09-06 MED ORDER — FENTANYL CITRATE (PF) 100 MCG/2ML IJ SOLN
INTRAMUSCULAR | Status: DC | PRN
  Administered 2018-09-06 (×4): 50 ug via INTRAVENOUS
  Administered 2018-09-06: 100 ug via INTRAVENOUS
  Administered 2018-09-06: 50 ug via INTRAVENOUS

## 2018-09-06 MED ORDER — ONDANSETRON HCL 4 MG/2ML IJ SOLN
4.0000 mg | Freq: Four times a day (QID) | INTRAMUSCULAR | Status: DC | PRN
  Administered 2018-09-06: 4 mg via INTRAVENOUS
  Filled 2018-09-06: qty 2

## 2018-09-06 MED ORDER — MAGIC MOUTHWASH
15.0000 mL | Freq: Four times a day (QID) | ORAL | Status: DC | PRN
  Filled 2018-09-06: qty 15

## 2018-09-06 MED ORDER — TRAMADOL HCL 50 MG PO TABS
50.0000 mg | ORAL_TABLET | Freq: Four times a day (QID) | ORAL | Status: DC | PRN
  Administered 2018-09-07 (×2): 100 mg via ORAL
  Administered 2018-09-07: 50 mg via ORAL
  Filled 2018-09-06 (×2): qty 1
  Filled 2018-09-06 (×2): qty 2

## 2018-09-06 MED ORDER — SODIUM CHLORIDE 0.9 % IV SOLN
INTRAVENOUS | Status: DC | PRN
  Administered 2018-09-06: 1000 mL via INTRAPERITONEAL

## 2018-09-06 MED ORDER — 0.9 % SODIUM CHLORIDE (POUR BTL) OPTIME
TOPICAL | Status: DC | PRN
  Administered 2018-09-06: 2000 mL

## 2018-09-06 MED ORDER — SCOPOLAMINE 1 MG/3DAYS TD PT72
1.0000 | MEDICATED_PATCH | TRANSDERMAL | Status: DC
  Administered 2018-09-06: 1.5 mg via TRANSDERMAL
  Filled 2018-09-06: qty 1

## 2018-09-06 MED ORDER — NEOMYCIN SULFATE 500 MG PO TABS: 1000.0000 mg | ORAL_TABLET | ORAL | Status: DC

## 2018-09-06 MED ORDER — PROMETHAZINE HCL 25 MG/ML IJ SOLN: 6.2500 mg | INTRAMUSCULAR | Status: DC | PRN

## 2018-09-06 MED ORDER — ALUM & MAG HYDROXIDE-SIMETH 200-200-20 MG/5ML PO SUSP: 30.0000 mL | Freq: Four times a day (QID) | ORAL | Status: DC | PRN

## 2018-09-06 MED ORDER — ONDANSETRON HCL 4 MG PO TABS: 4.0000 mg | ORAL_TABLET | Freq: Four times a day (QID) | ORAL | Status: DC | PRN

## 2018-09-06 MED ORDER — LACTATED RINGERS IV SOLN
INTRAVENOUS | Status: AC
  Administered 2018-09-06: 18:00:00 via INTRAVENOUS

## 2018-09-06 MED ORDER — CELECOXIB 200 MG PO CAPS
200.0000 mg | ORAL_CAPSULE | ORAL | Status: AC
  Administered 2018-09-06: 200 mg via ORAL
  Filled 2018-09-06: qty 1

## 2018-09-06 MED ORDER — HYDRALAZINE HCL 20 MG/ML IJ SOLN: 10.0000 mg | INTRAMUSCULAR | Status: DC | PRN

## 2018-09-06 MED ORDER — BUPIVACAINE LIPOSOME 1.3 % IJ SUSP
INTRAMUSCULAR | Status: DC | PRN
  Administered 2018-09-06: 20 mL

## 2018-09-06 MED ORDER — MEPERIDINE HCL 50 MG/ML IJ SOLN
6.2500 mg | INTRAMUSCULAR | Status: DC | PRN
  Administered 2018-09-06: 6.25 mg via INTRAVENOUS

## 2018-09-06 MED ORDER — SODIUM CHLORIDE 0.9 % IV SOLN
2.0000 g | INTRAVENOUS | Status: AC
  Administered 2018-09-06: 2 g via INTRAVENOUS
  Filled 2018-09-06: qty 2

## 2018-09-06 MED ORDER — LIDOCAINE HCL 2 % IJ SOLN
INTRAMUSCULAR | Status: AC
  Filled 2018-09-06: qty 20

## 2018-09-06 MED ORDER — DEXAMETHASONE SODIUM PHOSPHATE 10 MG/ML IJ SOLN
INTRAMUSCULAR | Status: AC
  Filled 2018-09-06: qty 1

## 2018-09-06 MED ORDER — FENTANYL CITRATE (PF) 100 MCG/2ML IJ SOLN
INTRAMUSCULAR | Status: AC
  Administered 2018-09-06: 25 ug via INTRAVENOUS
  Filled 2018-09-06: qty 2

## 2018-09-06 MED ORDER — PHENYLEPHRINE 40 MCG/ML (10ML) SYRINGE FOR IV PUSH (FOR BLOOD PRESSURE SUPPORT)
PREFILLED_SYRINGE | INTRAVENOUS | Status: AC
  Filled 2018-09-06: qty 10

## 2018-09-06 MED ORDER — PROCHLORPERAZINE MALEATE 10 MG PO TABS: 10.0000 mg | ORAL_TABLET | Freq: Four times a day (QID) | ORAL | Status: DC | PRN

## 2018-09-06 MED ORDER — FENTANYL CITRATE (PF) 100 MCG/2ML IJ SOLN
25.0000 ug | INTRAMUSCULAR | Status: DC | PRN
  Administered 2018-09-06: 25 ug via INTRAVENOUS

## 2018-09-06 MED ORDER — OXYMETAZOLINE HCL 0.05 % NA SOLN
1.0000 | Freq: Two times a day (BID) | NASAL | Status: DC | PRN
  Filled 2018-09-06: qty 15

## 2018-09-06 MED ORDER — ONDANSETRON HCL 4 MG/2ML IJ SOLN
INTRAMUSCULAR | Status: AC
  Filled 2018-09-06: qty 2

## 2018-09-06 MED ORDER — PROPOFOL 10 MG/ML IV BOLUS
INTRAVENOUS | Status: DC | PRN
  Administered 2018-09-06: 150 mg via INTRAVENOUS

## 2018-09-06 MED ORDER — ALVIMOPAN 12 MG PO CAPS
12.0000 mg | ORAL_CAPSULE | Freq: Two times a day (BID) | ORAL | Status: DC
  Administered 2018-09-07 – 2018-09-08 (×3): 12 mg via ORAL
  Filled 2018-09-06 (×3): qty 1

## 2018-09-06 MED ORDER — SODIUM CHLORIDE 0.9 % IV SOLN
2.0000 g | Freq: Two times a day (BID) | INTRAVENOUS | Status: AC
  Administered 2018-09-06: 2 g via INTRAVENOUS
  Filled 2018-09-06: qty 2

## 2018-09-06 MED ORDER — FENTANYL CITRATE (PF) 100 MCG/2ML IJ SOLN
INTRAMUSCULAR | Status: AC
  Filled 2018-09-06: qty 2

## 2018-09-06 MED ORDER — PROPOFOL 10 MG/ML IV BOLUS
INTRAVENOUS | Status: AC
  Filled 2018-09-06: qty 20

## 2018-09-06 MED ORDER — LACTATED RINGERS IR SOLN
Status: DC | PRN
  Administered 2018-09-06: 1000 mL

## 2018-09-06 MED ORDER — POLYETHYLENE GLYCOL 3350 17 GM/SCOOP PO POWD: 1.0000 | Freq: Once | ORAL | Status: DC

## 2018-09-06 MED ORDER — SODIUM CHLORIDE 0.9 % IV SOLN: 1000.0000 mL | Freq: Three times a day (TID) | INTRAVENOUS | Status: DC | PRN

## 2018-09-06 MED ORDER — METOPROLOL TARTRATE 5 MG/5ML IV SOLN: 5.0000 mg | Freq: Four times a day (QID) | INTRAVENOUS | Status: DC | PRN

## 2018-09-06 MED ORDER — MELATONIN 5 MG PO TABS
5.0000 mg | ORAL_TABLET | Freq: Every evening | ORAL | Status: DC | PRN
  Filled 2018-09-06: qty 1

## 2018-09-06 MED ORDER — LIDOCAINE 2% (20 MG/ML) 5 ML SYRINGE
INTRAMUSCULAR | Status: DC | PRN
  Administered 2018-09-06: 1.5 mg/kg/h via INTRAVENOUS

## 2018-09-06 MED ORDER — PROCHLORPERAZINE EDISYLATE 10 MG/2ML IJ SOLN: 5.0000 mg | Freq: Four times a day (QID) | INTRAMUSCULAR | Status: DC | PRN

## 2018-09-06 MED ORDER — LORAZEPAM 0.5 MG PO TABS
0.5000 mg | ORAL_TABLET | Freq: Three times a day (TID) | ORAL | Status: DC | PRN
  Filled 2018-09-06: qty 1

## 2018-09-06 MED ORDER — ENOXAPARIN SODIUM 40 MG/0.4ML ~~LOC~~ SOLN
40.0000 mg | SUBCUTANEOUS | Status: DC
  Administered 2018-09-07 – 2018-09-08 (×2): 40 mg via SUBCUTANEOUS
  Filled 2018-09-06 (×2): qty 0.4

## 2018-09-06 MED ORDER — METOCLOPRAMIDE HCL 5 MG/ML IJ SOLN: 10.0000 mg | Freq: Four times a day (QID) | INTRAMUSCULAR | Status: DC | PRN

## 2018-09-06 MED ORDER — ONDANSETRON HCL 4 MG/2ML IJ SOLN
INTRAMUSCULAR | Status: DC | PRN
  Administered 2018-09-06: 4 mg via INTRAVENOUS

## 2018-09-06 MED ORDER — KETAMINE HCL 10 MG/ML IJ SOLN
INTRAMUSCULAR | Status: DC | PRN
  Administered 2018-09-06: 30 mg via INTRAVENOUS

## 2018-09-06 MED ORDER — MIDAZOLAM HCL 2 MG/2ML IJ SOLN
INTRAMUSCULAR | Status: AC
  Filled 2018-09-06: qty 2

## 2018-09-06 MED ORDER — INDOCYANINE GREEN 25 MG IV SOLR
INTRAVENOUS | Status: DC | PRN
  Administered 2018-09-06: 7.5 mg via INTRAVENOUS

## 2018-09-06 MED ORDER — GABAPENTIN 300 MG PO CAPS
300.0000 mg | ORAL_CAPSULE | ORAL | Status: AC
  Administered 2018-09-06: 300 mg via ORAL
  Filled 2018-09-06: qty 1

## 2018-09-06 MED ORDER — LIDOCAINE 2% (20 MG/ML) 5 ML SYRINGE
INTRAMUSCULAR | Status: DC | PRN
  Administered 2018-09-06: 70 mg via INTRAVENOUS

## 2018-09-06 MED ORDER — DEXAMETHASONE SODIUM PHOSPHATE 10 MG/ML IJ SOLN
INTRAMUSCULAR | Status: DC | PRN
  Administered 2018-09-06: 6 mg via INTRAVENOUS

## 2018-09-06 MED ORDER — ACETAMINOPHEN 500 MG PO TABS
1000.0000 mg | ORAL_TABLET | Freq: Four times a day (QID) | ORAL | Status: DC
  Administered 2018-09-06 – 2018-09-08 (×6): 1000 mg via ORAL
  Filled 2018-09-06 (×6): qty 2

## 2018-09-06 MED ORDER — ZOLPIDEM TARTRATE 5 MG PO TABS
10.0000 mg | ORAL_TABLET | Freq: Every day | ORAL | Status: DC
  Administered 2018-09-07: 10 mg via ORAL
  Filled 2018-09-06 (×2): qty 2

## 2018-09-06 MED ORDER — LIDOCAINE 2% (20 MG/ML) 5 ML SYRINGE
INTRAMUSCULAR | Status: AC
  Filled 2018-09-06: qty 5

## 2018-09-06 MED ORDER — SACCHAROMYCES BOULARDII 250 MG PO CAPS
250.0000 mg | ORAL_CAPSULE | Freq: Two times a day (BID) | ORAL | Status: DC
  Administered 2018-09-06 – 2018-09-08 (×4): 250 mg via ORAL
  Filled 2018-09-06 (×4): qty 1

## 2018-09-06 MED ORDER — FENTANYL CITRATE (PF) 250 MCG/5ML IJ SOLN
INTRAMUSCULAR | Status: AC
  Filled 2018-09-06: qty 5

## 2018-09-06 MED ORDER — SUGAMMADEX SODIUM 200 MG/2ML IV SOLN
INTRAVENOUS | Status: AC
  Filled 2018-09-06: qty 2

## 2018-09-06 MED ORDER — METRONIDAZOLE 500 MG PO TABS: 1000.0000 mg | ORAL_TABLET | ORAL | Status: DC

## 2018-09-06 MED ORDER — ACETAMINOPHEN 500 MG PO TABS
1000.0000 mg | ORAL_TABLET | ORAL | Status: AC
  Administered 2018-09-06: 1000 mg via ORAL
  Filled 2018-09-06: qty 2

## 2018-09-06 MED ORDER — DIPHENHYDRAMINE HCL 12.5 MG/5ML PO ELIX: 12.5000 mg | ORAL_SOLUTION | Freq: Four times a day (QID) | ORAL | Status: DC | PRN

## 2018-09-06 MED ORDER — ROCURONIUM BROMIDE 100 MG/10ML IV SOLN
INTRAVENOUS | Status: AC
  Filled 2018-09-06: qty 1

## 2018-09-06 MED ORDER — LACTATED RINGERS IV SOLN
INTRAVENOUS | Status: DC
  Administered 2018-09-06 (×2): via INTRAVENOUS

## 2018-09-06 MED ORDER — BISACODYL 5 MG PO TBEC: 20.0000 mg | DELAYED_RELEASE_TABLET | Freq: Once | ORAL | Status: DC

## 2018-09-06 MED ORDER — LIP MEDEX EX OINT
TOPICAL_OINTMENT | CUTANEOUS | Status: AC
  Filled 2018-09-06: qty 7

## 2018-09-06 MED ORDER — METHOCARBAMOL 500 MG PO TABS
1000.0000 mg | ORAL_TABLET | Freq: Four times a day (QID) | ORAL | Status: DC | PRN
  Administered 2018-09-06 – 2018-09-07 (×2): 1000 mg via ORAL
  Filled 2018-09-06 (×3): qty 2

## 2018-09-06 MED ORDER — MIDAZOLAM HCL 5 MG/5ML IJ SOLN
INTRAMUSCULAR | Status: DC | PRN
  Administered 2018-09-06: 2 mg via INTRAVENOUS

## 2018-09-06 MED ORDER — ENOXAPARIN SODIUM 40 MG/0.4ML ~~LOC~~ SOLN
40.0000 mg | Freq: Once | SUBCUTANEOUS | Status: AC
  Administered 2018-09-06: 40 mg via SUBCUTANEOUS
  Filled 2018-09-06: qty 0.4

## 2018-09-06 MED ORDER — ENSURE SURGERY PO LIQD
237.0000 mL | Freq: Two times a day (BID) | ORAL | Status: DC
  Administered 2018-09-08: 237 mL via ORAL
  Filled 2018-09-06 (×4): qty 237

## 2018-09-06 MED ORDER — HYDROMORPHONE HCL 1 MG/ML IJ SOLN
0.5000 mg | INTRAMUSCULAR | Status: DC | PRN
  Administered 2018-09-06: 0.5 mg via INTRAVENOUS
  Filled 2018-09-06: qty 1

## 2018-09-06 SURGICAL SUPPLY — 104 items
APPLIER CLIP 5 13 M/L LIGAMAX5 (MISCELLANEOUS)
APPLIER CLIP ROT 10 11.4 M/L (STAPLE)
BLADE EXTENDED COATED 6.5IN (ELECTRODE) ×3 IMPLANT
CANNULA REDUC XI 12-8 STAPL (CANNULA) ×1
CANNULA REDUC XI 12-8MM STAPL (CANNULA) ×1
CANNULA REDUCER 12-8 DVNC XI (CANNULA) ×1 IMPLANT
CELLS DAT CNTRL 66122 CELL SVR (MISCELLANEOUS) IMPLANT
CLIP APPLIE 5 13 M/L LIGAMAX5 (MISCELLANEOUS) IMPLANT
CLIP APPLIE ROT 10 11.4 M/L (STAPLE) IMPLANT
CLIP VESOLOCK LG 6/CT PURPLE (CLIP) IMPLANT
CLIP VESOLOCK MED LG 6/CT (CLIP) IMPLANT
COVER SURGICAL LIGHT HANDLE (MISCELLANEOUS) ×6 IMPLANT
COVER TIP SHEARS 8 DVNC (MISCELLANEOUS) ×1 IMPLANT
COVER TIP SHEARS 8MM DA VINCI (MISCELLANEOUS) ×2
COVER WAND RF STERILE (DRAPES) ×3 IMPLANT
DEVICE TROCAR PUNCTURE CLOSURE (ENDOMECHANICALS) IMPLANT
DRAIN CHANNEL 19F RND (DRAIN) ×3 IMPLANT
DRAPE ARM DVNC X/XI (DISPOSABLE) ×4 IMPLANT
DRAPE COLUMN DVNC XI (DISPOSABLE) ×1 IMPLANT
DRAPE DA VINCI XI ARM (DISPOSABLE) ×8
DRAPE DA VINCI XI COLUMN (DISPOSABLE) ×2
DRAPE SURG IRRIG POUCH 19X23 (DRAPES) ×3 IMPLANT
DRSG OPSITE POSTOP 4X10 (GAUZE/BANDAGES/DRESSINGS) IMPLANT
DRSG OPSITE POSTOP 4X6 (GAUZE/BANDAGES/DRESSINGS) ×3 IMPLANT
DRSG OPSITE POSTOP 4X8 (GAUZE/BANDAGES/DRESSINGS) IMPLANT
DRSG TEGADERM 2-3/8X2-3/4 SM (GAUZE/BANDAGES/DRESSINGS) ×15 IMPLANT
DRSG TEGADERM 4X4.75 (GAUZE/BANDAGES/DRESSINGS) IMPLANT
ELECT PENCIL ROCKER SW 15FT (MISCELLANEOUS) ×3 IMPLANT
ELECT REM PT RETURN 15FT ADLT (MISCELLANEOUS) ×3 IMPLANT
ENDOLOOP SUT PDS II  0 18 (SUTURE)
ENDOLOOP SUT PDS II 0 18 (SUTURE) IMPLANT
EVACUATOR SILICONE 100CC (DRAIN) ×3 IMPLANT
GAUZE SPONGE 2X2 8PLY STRL LF (GAUZE/BANDAGES/DRESSINGS) ×1 IMPLANT
GAUZE SPONGE 4X4 12PLY STRL (GAUZE/BANDAGES/DRESSINGS) IMPLANT
GLOVE BIOGEL PI IND STRL 6 (GLOVE) ×2 IMPLANT
GLOVE BIOGEL PI IND STRL 6.5 (GLOVE) ×1 IMPLANT
GLOVE BIOGEL PI INDICATOR 6 (GLOVE) ×4
GLOVE BIOGEL PI INDICATOR 6.5 (GLOVE) ×2
GLOVE ECLIPSE 8.0 STRL XLNG CF (GLOVE) ×15 IMPLANT
GLOVE INDICATOR 6.5 STRL GRN (GLOVE) ×6 IMPLANT
GLOVE INDICATOR 8.0 STRL GRN (GLOVE) ×15 IMPLANT
GLOVE SURG SS PI 6.0 STRL IVOR (GLOVE) ×3 IMPLANT
GOWN STRL REUS W/ TWL LRG LVL3 (GOWN DISPOSABLE) ×3 IMPLANT
GOWN STRL REUS W/TWL LRG LVL3 (GOWN DISPOSABLE) ×6
GOWN STRL REUS W/TWL XL LVL3 (GOWN DISPOSABLE) ×15 IMPLANT
GRASPER SUT TROCAR 14GX15 (MISCELLANEOUS) ×3 IMPLANT
HOLDER FOLEY CATH W/STRAP (MISCELLANEOUS) ×3 IMPLANT
IRRIG SUCT STRYKERFLOW 2 WTIP (MISCELLANEOUS) ×3
IRRIGATION SUCT STRKRFLW 2 WTP (MISCELLANEOUS) ×1 IMPLANT
KIT PROCEDURE DA VINCI SI (MISCELLANEOUS) ×2
KIT PROCEDURE DVNC SI (MISCELLANEOUS) ×1 IMPLANT
NEEDLE INSUFFLATION 14GA 120MM (NEEDLE) ×3 IMPLANT
PACK COLON (CUSTOM PROCEDURE TRAY) ×3 IMPLANT
PACK UNIVERSAL I (CUSTOM PROCEDURE TRAY) ×3 IMPLANT
PAD POSITIONING PINK XL (MISCELLANEOUS) ×3 IMPLANT
PORT LAP GEL ALEXIS MED 5-9CM (MISCELLANEOUS) ×3 IMPLANT
PROTECTOR NERVE ULNAR (MISCELLANEOUS) ×6 IMPLANT
RTRCTR WOUND ALEXIS 18CM MED (MISCELLANEOUS)
SCISSORS LAP 5X35 DISP (ENDOMECHANICALS) ×3 IMPLANT
SEAL CANN UNIV 5-8 DVNC XI (MISCELLANEOUS) ×4 IMPLANT
SEAL XI 5MM-8MM UNIVERSAL (MISCELLANEOUS) ×8
SEALER VESSEL DA VINCI XI (MISCELLANEOUS) ×2
SEALER VESSEL EXT DVNC XI (MISCELLANEOUS) ×1 IMPLANT
SLEEVE ADV FIXATION 5X100MM (TROCAR) IMPLANT
SOLUTION ELECTROLUBE (MISCELLANEOUS) ×3 IMPLANT
SPONGE GAUZE 2X2 STER 10/PKG (GAUZE/BANDAGES/DRESSINGS) ×2
STAPLER 45 BLU RELOAD XI (STAPLE) IMPLANT
STAPLER 45 BLUE RELOAD XI (STAPLE)
STAPLER 45 GREEN RELOAD XI (STAPLE) ×2
STAPLER 45 GRN RELOAD XI (STAPLE) ×1 IMPLANT
STAPLER CANNULA SEAL DVNC XI (STAPLE) ×1 IMPLANT
STAPLER CANNULA SEAL XI (STAPLE) ×2
STAPLER ECHELON POWER CIR 31 (STAPLE) ×3 IMPLANT
STAPLER SHEATH (SHEATH) ×2
STAPLER SHEATH ENDOWRIST DVNC (SHEATH) ×1 IMPLANT
SURGILUBE 2OZ TUBE FLIPTOP (MISCELLANEOUS) ×3 IMPLANT
SUT MNCRL AB 4-0 PS2 18 (SUTURE) ×6 IMPLANT
SUT PDS AB 1 CTX 36 (SUTURE) IMPLANT
SUT PDS AB 1 TP1 96 (SUTURE) IMPLANT
SUT PROLENE 0 CT 2 (SUTURE) IMPLANT
SUT PROLENE 2 0 KS (SUTURE) IMPLANT
SUT PROLENE 2 0 SH DA (SUTURE) IMPLANT
SUT SILK 2 0 (SUTURE)
SUT SILK 2 0 SH CR/8 (SUTURE) IMPLANT
SUT SILK 2-0 18XBRD TIE 12 (SUTURE) IMPLANT
SUT SILK 3 0 (SUTURE) ×2
SUT SILK 3 0 SH CR/8 (SUTURE) ×3 IMPLANT
SUT SILK 3-0 18XBRD TIE 12 (SUTURE) ×1 IMPLANT
SUT V-LOC BARB 180 2/0GR6 GS22 (SUTURE)
SUT VIC AB 3-0 SH 18 (SUTURE) IMPLANT
SUT VIC AB 3-0 SH 27 (SUTURE)
SUT VIC AB 3-0 SH 27XBRD (SUTURE) IMPLANT
SUT VICRYL 0 UR6 27IN ABS (SUTURE) ×3 IMPLANT
SUTURE V-LC BRB 180 2/0GR6GS22 (SUTURE) IMPLANT
SYR 10ML LL (SYRINGE) ×3 IMPLANT
SYS LAPSCP GELPORT 120MM (MISCELLANEOUS)
SYSTEM LAPSCP GELPORT 120MM (MISCELLANEOUS) IMPLANT
TAPE UMBILICAL COTTON 1/8X30 (MISCELLANEOUS) ×3 IMPLANT
TOWEL OR NON WOVEN STRL DISP B (DISPOSABLE) ×3 IMPLANT
TRAY FOLEY MTR SLVR 14FR STAT (SET/KITS/TRAYS/PACK) ×3 IMPLANT
TROCAR ADV FIXATION 5X100MM (TROCAR) ×3 IMPLANT
TUBING CONNECTING 10 (TUBING) ×4 IMPLANT
TUBING CONNECTING 10' (TUBING) ×2
TUBING INSUFFLATION 10FT LAP (TUBING) ×3 IMPLANT

## 2018-09-06 NOTE — Anesthesia Preprocedure Evaluation (Signed)
Anesthesia Evaluation  Patient identified by MRN, date of birth, ID band Patient awake    Reviewed: Allergy & Precautions, NPO status , Patient's Chart, lab work & pertinent test results  Airway Mallampati: II  TM Distance: >3 FB Neck ROM: Full    Dental no notable dental hx. (+) Dental Advisory Given   Pulmonary neg pulmonary ROS,    Pulmonary exam normal        Cardiovascular negative cardio ROS Normal cardiovascular exam     Neuro/Psych PSYCHIATRIC DISORDERS Anxiety negative neurological ROS     GI/Hepatic Neg liver ROS,   Endo/Other  negative endocrine ROS  Renal/GU negative Renal ROS  negative genitourinary   Musculoskeletal negative musculoskeletal ROS (+)   Abdominal   Peds negative pediatric ROS (+)  Hematology negative hematology ROS (+)   Anesthesia Other Findings   Reproductive/Obstetrics negative OB ROS                             Anesthesia Physical Anesthesia Plan  ASA: II  Anesthesia Plan: General   Post-op Pain Management:    Induction: Intravenous  PONV Risk Score and Plan: 3 and Ondansetron, Scopolamine patch - Pre-op and Dexamethasone  Airway Management Planned: Oral ETT  Additional Equipment:   Intra-op Plan:   Post-operative Plan: Extubation in OR  Informed Consent: I have reviewed the patients History and Physical, chart, labs and discussed the procedure including the risks, benefits and alternatives for the proposed anesthesia with the patient or authorized representative who has indicated his/her understanding and acceptance.     Dental advisory given  Plan Discussed with: CRNA and Anesthesiologist  Anesthesia Plan Comments:         Anesthesia Quick Evaluation

## 2018-09-06 NOTE — Anesthesia Procedure Notes (Signed)
Procedure Name: Intubation Date/Time: 09/06/2018 12:49 PM Performed by: Montel Clock, CRNA Pre-anesthesia Checklist: Patient identified, Emergency Drugs available, Suction available, Patient being monitored and Timeout performed Patient Re-evaluated:Patient Re-evaluated prior to induction Oxygen Delivery Method: Circle system utilized Preoxygenation: Pre-oxygenation with 100% oxygen Induction Type: IV induction Ventilation: Mask ventilation without difficulty and Oral airway inserted - appropriate to patient size Laryngoscope Size: Mac and 3 Grade View: Grade II Tube type: Oral Tube size: 7.0 mm Number of attempts: 1 Airway Equipment and Method: Stylet Placement Confirmation: ETT inserted through vocal cords under direct vision,  positive ETCO2 and breath sounds checked- equal and bilateral Secured at: 21 cm Tube secured with: Tape Dental Injury: Teeth and Oropharynx as per pre-operative assessment

## 2018-09-06 NOTE — Anesthesia Postprocedure Evaluation (Signed)
Anesthesia Post Note  Patient: Rebekah Schmidt  Procedure(s) Performed: XI ROBOTIC LOW ANTERIOR RESECTION ERAS PATHWAY WITH FIREFLY (N/A ) RIGID PROCTOSCOPY (N/A )     Patient location during evaluation: PACU Anesthesia Type: General Level of consciousness: awake and alert Pain management: pain level controlled Vital Signs Assessment: post-procedure vital signs reviewed and stable Respiratory status: spontaneous breathing, nonlabored ventilation, respiratory function stable and patient connected to nasal cannula oxygen Cardiovascular status: blood pressure returned to baseline and stable Postop Assessment: no apparent nausea or vomiting Anesthetic complications: no    Last Vitals:  Vitals:   09/06/18 1744 09/06/18 1850  BP: (!) 160/91 (!) 142/72  Pulse: (!) 105 99  Resp: 16 15  Temp: 36.6 C (!) 36.4 C  SpO2: 100% 100%    Last Pain:  Vitals:   09/06/18 1850  TempSrc: Oral  PainSc:                  Tiajuana Amass

## 2018-09-06 NOTE — Op Note (Signed)
09/06/2018  3:58 PM  PATIENT:  Rebekah Schmidt  68 y.o. female  Patient Care Team: Yvone Neu, MD as PCP - General (Family Medicine) Michael Boston, MD as Consulting Physician (General Surgery) Gatha Mayer, MD as Consulting Physician (Gastroenterology)  PRE-OPERATIVE DIAGNOSIS:  STRICTURE OF SIGMOID Rebekah  POST-OPERATIVE DIAGNOSIS:  STRICTURE OF SIGMOID Rebekah  PROCEDURE:   XI ROBOTIC LOW ANTERIOR RECTOSIGMOID RESECTION ASSESSMENT OF TISSUE PERFUSION WITH FIREFLY IMMUNOFLUORESCENCE  RIGID PROCTOSCOPY  SURGEON:  Adin Hector, MD  ASSISTANT:  Carlena Hurl, PA-C Leighton Ruff, MD, FACS.  ANESTHESIA:   local and general  Nerve block provided with liposomal bupivacaine (Experel) mixed with 0.25% bupivacaine as a Bilateral TAP block x30 mL at the level of the transverse abdominis & preperitoneal spaces along the flank at the anterior axillary line, from subcostal ridge to iliac crest under laparoscopic guidance    EBL:  Total I/O In: 1000 [I.V.:1000] Out: 300 [Urine:200; Blood:100]  Delay start of Pharmacological VTE agent (>24hrs) due to surgical blood loss or risk of bleeding:  no  DRAINS: No  SPECIMEN: Rectosigmoid Rebekah.  Open end proximal.  DISPOSITION OF SPECIMEN:  PATHOLOGY  COUNTS:  YES  PLAN OF CARE: Admit to inpatient   PATIENT DISPOSITION:  PACU - hemodynamically stable.  INDICATION:    Pleasant woman with bowel changes and crampiness.  Found to have inflammation of her sigmoid Rebekah by endoscopy with stricturing.  Confirmed by CAT scan.  Seem more diverticular in nature and not neoplastic.  Gastroenterology recommended surgical evaluation.  I recommended segmental resection:  The anatomy & physiology of the digestive tract was discussed.  The pathophysiology was discussed.  Natural history risks without surgery was discussed.   I worked to give an overview of the disease and the frequent need to have multispecialty involvement.  I feel the risks  of no intervention will lead to serious problems that outweigh the operative risks; therefore, I recommended a partial colectomy to remove the pathology.  Laparoscopic & open techniques were discussed.   Risks such as bleeding, infection, abscess, leak, reoperation, possible ostomy, hernia, heart attack, death, and other risks were discussed.  I noted a good likelihood this will help address the problem.   Goals of post-operative recovery were discussed as well.  We will work to minimize complications.  Educational materials on the pathology had been given in the office.  Questions were answered.    The patient expressed understanding & wished to proceed with surgery.  OR FINDINGS:   Patient had very thickened and inflamed rectosigmoid Rebekah.  Seem classic for diverticular disease.  No obvious metastatic disease on visceral parietal peritoneum or liver.  The anastomosis rests 8-9 cm from the anal verge by rigid proctoscopy.  It is an end descending Rebekah to anterior rectal wall 31 EEA stapled anastomosis  DESCRIPTION:   Informed consent was confirmed.  The patient underwent general anaesthesia without difficulty.  The patient was positioned appropriately.  VTE prevention in place.  The patient was clipped, prepped, & draped in a sterile fashion.  Surgical timeout confirmed our plan.  The patient was positioned in reverse Trendelenburg.  Abdominal entry was gained using Varess technique at the left subcostal ridge on the anterior abdominal wall.  No elevated EtCO2 noted.  Port placed.  Camera inspection revealed no injury.  Extra ports were carefully placed under direct laparoscopic visualization.  I freed some omental adhesions off of the anterior pelvic brim.  I reflected the greater omentum and the  upper abdomen the small bowel in the upper abdomen.  The patient was carefully positioned.  The Intuitive daVinci robot was docked with camera & instruments carefully placed.  The patient had an  obviously inflamed rectosigmoid Rebekah densely adherent to the left lower lateral sidewall going into the true left pelvis.   I did free up some adhesions superiorly to help straighten out untwist this region.  This allowed me to better elevate the rectosigmoid mesentery allow me to do a medial to lateral approach to free the rectosigmoid Rebekah off the retroperitoneum away from the main area of inflammation.  I scored the base of peritoneum of the medial side of the mesentery of the elevated left Rebekah from the ligament of Treitz to the peritoneal reflection of the mid rectum.   I elevated the sigmoid mesentery and entered into the retro-mesenteric plane. We were able to identify the left ureter and gonadal vessels.  Initially wanted to elevate with the specimen but were able to identify and return to the retroperitoneal position.  Carefully follow that more distally towards the pelvic brim.  There is dense concrete inflammation but eventually we are pretty carefully bluntly free off a plane and gently transected the rectosigmoid Rebekah off the retroperitoneum, taking care to preserve the ureter and gonadal vessels.  I did isolate the inferior mesenteric artery (IMA) pedicle but did not ligate it yet.  I continued distally and got into the avascular plane posterior to the mesorectum. This allowed me to help mobilize the rectum as well by freeing the mesorectum off the sacrum.  I mobilized the peritoneal coverings towards the peritoneal reflection on both the right and left sides of the rectum.  I stayed away from the right and left ureters.  I kept the lateral vascular pedicles to the rectum intact.  I skeletonized the lymph nodes off the inferior mesenteric artery pedicle.  I went down to its takeoff from the aorta.  I isolated the inferior mesenteric vein off of the ligament of Treitz just cephalad to that as well.  After confirming the left ureter was out of the way, I went ahead and ligated the inferior  mesenteric artery pedicle just near its takeoff from the aorta.  I did ligate the inferior mesenteric vein in a similar fashion.  We ensured hemostasis.  There was persistent inflammation of the proximal rectum as well.  Therefore decided to transect a little more distally towards the midline   I skeletonized the mesorectum at the junction of the proximal and mid rectum for the distal point of resection.  I mobilized the left Rebekah in a lateral to medial fashion off the line of Toldt up towards the splenic flexure superior to the entire left kidney and Gerota's fascia.  To ensure good mobilization of the remaining left Rebekah to reach into the pelvis.  I mobilized the splenic flexure in a lateral to medial fashion and transected greater omentum off the proximal descending Rebekah and splenic flexure.  That allowed the left Rebekah to be more mobile and reach down into the pelvis.  I chose a region at the descending/sigmoid junction that was soft and easily reached down to the rectal stump.  I transected the mesentery of the Rebekah radially to preserve remaining Rebekah blood supply.  Then had anesthesia ostomy use firefly intravenously.  Was able to see the gradient for since perfused the left Rebekah right to the preselected transection point at the descending/sigmoid junction.  Also confirmed at the proximal/mid rectal junction  good perfusion of the area mid and distal rectum.   I transected at the proximal/mid rectal junction using a robotic 45 mm stapler. I created an extraction incision through a small Pfannenstiel incision in the suprapubic region.  Placed a wound protector.  I was able to eviscerate the rectosigmoid and descending Rebekah out the wound.   I clamped the Rebekah proximal to this area using a reusable pursestringer device.  Passed a 2-0 Keith needle. I transected at the descending/sigmoid junction with a scalpel. I got healthy bleeding mucosa.  We sent the rectosigmoid Rebekah specimen off to go to  pathology.  We sized the Rebekah orifice.  I chose a 33 EEA anvil stapler system.  I reinforced the prolene pursestring with interrupted silk suture.  I placed the anvil to the open end of the proximal remaining Rebekah and closed around it using the pursestring.    We did copious irrigation with crystalloid solution.  Hemostasis was good.  The distal end of the remaining Rebekah easily reached down to the rectal stump, therefore, further splenic flexure mobilization was not needed.      Ms. Lemar Lofty scrubbed down and did gentle anal dilation and advanced the EEA stapler up the rectal stump.  Would not reach all the way to the end so therefore the spike was brought out the anterior mid rectal wall 5 cm distal to the staple line under direct visualization.  I attached the anvil of the proximal Rebekah the spike of the stapler. Anvil was tightened down and held clamped for 60 seconds. The EEA stapler was fired and held clamped for 30 seconds. The stapler was released & removed. We noted 2 excellent anastomotic rings. Blue stitch is in the proximal ring.  Ms. Lemar Lofty & Dr Marcello Moores did rigid proctoscopy with insufflation.  We did a final irrigation of antibiotic solution (900 mg clindamycin/240 mg gentamicin in a liter of crystalloid) & held that for the pelvic air leak test .  The rectum was insufflated the rectum while clamping the Rebekah proximal to that anastomosis.  There was a negative air leak test. There was no tension of mesentery or bowel at the anastomosis.   Tissues looked viable.  Ureters & bowel uninjured.  The anastomosis looked healthy.  Brought the greater omentum out of the upper abdomen to its more natural position reaching down to the lower abdomen and upper pelvis.  I completed the bilateral tap block with Exparel & 0.25% bupivacaine  Endoluminal gas was evacuated.  Ports & wound protector removed.  We changed gloves & redraped the patient per Rebekah SSI prevention protocol.  We aspirated the antibiotic  irrigation.  Hemostasis was good.  Sterile unused instruments were used from this point.  I closed the skin at the port sites using Monocryl stitch and sterile dressing.  I closed the extraction wound using a 0 Vicryl vertical peritoneal closure and a #1 PDS transverse anterior rectal fascial closure like a small Pfannenstiel closure. I closed the skin with some interrupted Monocryl stitches. I placed antibiotic-soaked wicks into the closure at the corners x2.  I placed sterile dressings.     Patient is being extubated go to recovery room. I had discussed postop care with the patient in detail the office & in the holding area. Instructions are written. I discussed operative findings, updated the patient's status, discussed probable steps to recovery, and gave postoperative recommendations to the Patient's sister.  Recommendations were made.  Questions were answered.  She expressed understanding & appreciation.  Adin Hector, M.D., F.A.C.S. Gastrointestinal and Minimally Invasive Surgery [facility] [address], [place],  [id number] [phone] Main / Paging

## 2018-09-06 NOTE — Transfer of Care (Signed)
Immediate Anesthesia Transfer of Care Note  Patient: Rebekah Schmidt  Procedure(s) Performed: XI ROBOTIC LOW ANTERIOR RESECTION ERAS PATHWAY WITH FIREFLY (N/A ) RIGID PROCTOSCOPY (N/A )  Patient Location: PACU  Anesthesia Type:General  Level of Consciousness: drowsy and patient cooperative  Airway & Oxygen Therapy: Patient Spontanous Breathing and Patient connected to face mask oxygen  Post-op Assessment: Report given to RN and Post -op Vital signs reviewed and stable  Post vital signs: Reviewed and stable  Last Vitals:  Vitals Value Taken Time  BP 185/92 09/06/2018  4:00 PM  Temp    Pulse 122 09/06/2018  4:03 PM  Resp 16 09/06/2018  4:03 PM  SpO2 99 % 09/06/2018  4:03 PM  Vitals shown include unvalidated device data.  Last Pain:  Vitals:   09/06/18 1046  TempSrc:   PainSc: 0-No pain         Complications: No apparent anesthesia complications

## 2018-09-06 NOTE — H&P (Signed)
Colon Branch DOB: 1950-08-23 Married / Language: English / Race: White Female  Patient Care Team: Yvone Neu, MD as PCP - General (Family Medicine) Michael Boston, MD as Consulting Physician (General Surgery) Gatha Mayer, MD as Consulting Physician (Gastroenterology)  ` Patient sent for surgical consultation at the request of Dr Laney Pastor  Chief Complaint: Worsening bloating and constipation with sigmoid stricture ` ` The patient is a pleasant will not from [facility]. Usually moves her bowels every day. However starting in the summertime she noticed worsening constipation. She's tried many things to help compensate for this. MiraLAX, Dulcolax, milk of magnesia, Colace. We will get backed up and then have the clean out. Progressively worsening. Frustrating. Had colonoscopy last month noting a stricture of the sigmoid colon. Very tight. Appeared to be smooth and benign. More likely diverticular. More aggressive bowel regimen recommended. However over the past month, she feels like things have worsened. Feeling more bloated. CAT scan showed dilated colon. This cancer metastatic disease. Primary care physician requested surgical evaluation over concerns of worsening sigmoid stricture and need for resection.  Patient had laparoscopic cholecystectomy, tubal ligation, endometriosis surgery with hysterectomy. No recent abdominal surgeries. No history of bowel obstructions. She does not smoke. She is nondiabetic. No cardiovascular issues. She can walk at least half hour without difficulty. Had an underwhelming colonoscopy in 2007. Recent colonoscopy with sigmoid stricture.  Underwent Cologuard a few years ago.  (Review of systems as stated in this history (HPI) or in the review of systems. Otherwise all other 12 point ROS are negative) ` ` `   Past Surgical History Geni Bers [place], RMA; 07/04/2018 11:23 AM) Appendectomy Gallbladder  Surgery - Laparoscopic Hysterectomy (not due to cancer) - Complete Oral Surgery  Diagnostic Studies History Geni Bers [place], RMA; 07/04/2018 11:23 AM) Colonoscopy within last year Mammogram within last year  Allergies Geni Bers [place], RMA; 07/04/2018 11:24 AM) Morphine Derivatives Itching. Allergies Reconciled  Medication History [employer], RMA; 07/04/2018 11:27 AM) LORazepam (0.5MG  Tablet, Oral) Active. Zolpidem Tartrate (10MG  Tablet, Oral) Active. Hyoscyamine Sulfate (0.125MG  Tablet, Oral) Active. Pantoprazole Sodium (20MG  Tablet DR, Oral) Active. Vitamin D (Oral) Specific strength unknown - Active. Melatonin (5MG  Tablet, Oral) Active. Afrin (0.025% Solution, Nasal) Active. Cyclobenzaprine HCl (5MG  Tablet, Oral) Active. Medications Reconciled  Social History Geni Bers [place], RMA; 07/04/2018 11:23 AM) Alcohol use Occasional alcohol use. Caffeine use Carbonated beverages, Coffee, Tea. No drug use Tobacco use Never smoker.  Family History Geni Bers [place], [place]; 07/04/2018 11:23 AM) First Degree Relatives No pertinent family history  Pregnancy / Birth History Geni Bers [place], [place]; 07/04/2018 11:23 AM) Age at menarche 33 years. Age of menopause 45-50 Gravida 1 Maternal age 62-20 Para 1  Other Problems Geni Bers [place], RMA; 07/04/2018 11:23 AM) Anxiety Disorder Arthritis Back Pain Diverticulosis Gastroesophageal Reflux Disease     Review of Systems Geni Bers [place] RMA; 07/04/2018 11:23 AM) General Present- Appetite Loss, Fatigue and Weight Loss. Not Present- Chills, Fever, Night Sweats and Weight Gain. Skin Not Present- Change in Wart/Mole, Dryness, Hives, Jaundice, New Lesions, Non-Healing Wounds, Rash and Ulcer. HEENT Not Present- Earache, Hearing Loss, Hoarseness, Nose Bleed, Oral Ulcers, Ringing in the Ears, Seasonal Allergies, Sinus Pain, Sore Throat, Visual Disturbances, Wears  glasses/contact lenses and Yellow Eyes. Respiratory Not Present- Bloody sputum, Chronic Cough, Difficulty Breathing, Snoring and Wheezing. Breast Not Present- Breast Mass, Breast Pain, Nipple Discharge and Skin Changes. Cardiovascular Not Present- Chest Pain, Difficulty Breathing Lying Down, Leg Cramps, Palpitations, Rapid Heart Rate, Shortness of Breath and Swelling of Extremities. Gastrointestinal Present-  Abdominal Pain, Bloating, Change in Bowel Habits, Constipation, Gets full quickly at meals and Rectal Pain. Not Present- Bloody Stool, Chronic diarrhea, Difficulty Swallowing, Excessive gas, Hemorrhoids, Indigestion, Nausea and Vomiting. Female Genitourinary Not Present- Frequency, Nocturia, Painful Urination, Pelvic Pain and Urgency. Musculoskeletal Not Present- Back Pain, Joint Pain, Joint Stiffness, Muscle Pain, Muscle Weakness and Swelling of Extremities. Neurological Not Present- Decreased Memory, Fainting, Headaches, Numbness, Seizures, Tingling, Tremor, Trouble walking and Weakness. Psychiatric Present- Anxiety. Not Present- Bipolar, Change in Sleep Pattern, Depression, Fearful and Frequent crying. Endocrine Not Present- Cold Intolerance, Excessive Hunger, Hair Changes, Heat Intolerance, Hot flashes and New Diabetes. Hematology Not Present- Blood Thinners, Easy Bruising, Excessive bleeding, Gland problems, HIV and Persistent Infections.  Vitals Geni Bers [place] RMA; 07/04/2018 11:27 AM) 07/04/2018 11:27 AM Weight: 152 lb Height: 65in Body Surface Area: 1.76 m Body Mass Index: 25.29 kg/m  Temp.: 97.77F(Temporal)  Pulse: 131 (Regular)  P.OX: 98% (Room air) BP: 142/90 (Sitting, Left Arm, Standard)      Physical Exam Adin Hector MD; 07/04/2018 11:59 AM)  General Mental Status-Alert. General Appearance-Not in acute distress, Not Sickly. Orientation-Oriented X3. Hydration-Well hydrated. Voice-Normal.  Integumentary Global Assessment Upon  inspection and palpation of skin surfaces of the - Axillae: non-tender, no inflammation or ulceration, no drainage. and Distribution of scalp and body hair is normal. General Characteristics Temperature - normal warmth is noted.  Head and Neck Head-normocephalic, atraumatic with no lesions or palpable masses. Face Global Assessment - atraumatic, no absence of expression. Neck Global Assessment - no abnormal movements, no bruit auscultated on the right, no bruit auscultated on the left, no decreased range of motion, non-tender. Trachea-midline. Thyroid Gland Characteristics - non-tender.  Eye Eyeball - Left-Extraocular movements intact, No Nystagmus. Eyeball - Right-Extraocular movements intact, No Nystagmus. Cornea - Left-No Hazy. Cornea - Right-No Hazy. Sclera/Conjunctiva - Left-No scleral icterus, No Discharge. Sclera/Conjunctiva - Right-No scleral icterus, No Discharge. Pupil - Left-Direct reaction to light normal. Pupil - Right-Direct reaction to light normal. Note: Wears glasses. Vision corrected  ENMT Ears Pinna - Left - no drainage observed, no generalized tenderness observed. Right - no drainage observed, no generalized tenderness observed. Nose and Sinuses External Inspection of the Nose - no destructive lesion observed. Inspection of the nares - Left - quiet respiration. Right - quiet respiration. Mouth and Throat Lips - Upper Lip - no fissures observed, no pallor noted. Lower Lip - no fissures observed, no pallor noted. Nasopharynx - no discharge present. Oral Cavity/Oropharynx - Tongue - no dryness observed. Oral Mucosa - no cyanosis observed. Hypopharynx - no evidence of airway distress observed.  Chest and Lung Exam Inspection Movements - Normal and Symmetrical. Accessory muscles - No use of accessory muscles in breathing. Palpation Palpation of the chest reveals - Non-tender. Auscultation Breath sounds - Normal and  Clear.  Cardiovascular Auscultation Rhythm - Regular. Murmurs & Other Heart Sounds - Auscultation of the heart reveals - No Murmurs and No Systolic Clicks.  Abdomen Inspection Inspection of the abdomen reveals - No Visible peristalsis and No Abnormal pulsations. Umbilicus - No Bleeding, No Urine drainage. Palpation/Percussion Palpation and Percussion of the abdomen reveal - Soft, Non Tender, No Rebound tenderness, No Rigidity (guarding) and No Cutaneous hyperesthesia. Note: Abdomen soft. Not distended. No distasis recti. No umbilical or other anterior abdominal wall hernias. Small laparoscopic incisions. Nontender.  Female Genitourinary Sexual Maturity Tanner 5 - Adult hair pattern. Note: No vaginal bleeding nor discharge  Peripheral Vascular Upper Extremity Inspection - Left - No Cyanotic nailbeds, Not Ischemic.  Right - No Cyanotic nailbeds, Not Ischemic.  Neurologic Neurologic evaluation reveals -normal attention span and ability to concentrate, able to name objects and repeat phrases. Appropriate fund of knowledge , normal sensation and normal coordination. Mental Status Affect - not angry, not paranoid. Cranial Nerves-Normal Bilaterally. Gait-Normal.  Neuropsychiatric Mental status exam performed with findings of-able to articulate well with normal speech/language, rate, volume and coherence, thought content normal with ability to perform basic computations and apply abstract reasoning and no evidence of hallucinations, delusions, obsessions or homicidal/suicidal ideation.  Musculoskeletal Global Assessment Spine, Ribs and Pelvis - no instability, subluxation or laxity. Right Upper Extremity - no instability, subluxation or laxity.  Lymphatic Head & Neck  General Head & Neck Lymphatics: Bilateral - Description - No Localized lymphadenopathy. Axillary  General Axillary Region: Bilateral - Description - No Localized lymphadenopathy. Femoral &  Inguinal  Generalized Femoral & Inguinal Lymphatics: Left - Description - No Localized lymphadenopathy. Right - Description - No Localized lymphadenopathy.    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 Struggling with bloating and severe constipation despite more aggressive bowel regimen.  She is miserable and wishes to have surgery to remove the problem area. I think it is reasonable consider that. Good candidate for a minimally invasive approach.  I recommend she switched to a. Blenderized diet for the next several days to help get her bowels are to control. Do MiraLAX half dose twice a day to more gently but persistently keep her from getting backed up. Then retry more solid food as tolerated.  She's had some unintentional weight loss of about 10 pounds. She is worried she's not eating enough. She is interested and supplemental shakes. I think that is a good idea. Ensure or boost. 2 shakes a day to help out as well. She does not look frankly cachectic and there is no evidence of any cancer, sulfa just some mild malnutrition at this point.  No new events.  Ready for surgery  We will work to try and find a convenient time for sigmoid colectomy to remove the stricture with worsening symptoms.Written instructions provided The anatomy & physiology of the digestive tract was discussed. The pathophysiology of the colon was discussed. Natural history risks without surgery was discussed. I feel the risks of no intervention will lead to serious problems that outweigh the operative risks; therefore, I recommended a partial colectomy to remove the pathology. Minimally invasive (Robotic/Laparoscopic) & open techniques were discussed.  Risks such as bleeding, infection, abscess, leak, reoperation, possible ostomy, hernia, heart attack, death, and other risks were discussed. I noted a good  likelihood this will help address the problem. Goals of post-operative recovery were discussed as well. Need for adequate nutrition, daily bowel regimen and healthy physical activity, to optimize recovery was noted as well. We will work to minimize complications. Educational materials were available as well. Questions were answered. The patient expresses understanding & wishes to proceed with surgery.    Adin Hector, MD, FACS, MASCRS Gastrointestinal and Minimally Invasive Surgery    [address] [place], [place] [address] [phone] Main / Paging [phone] Fax

## 2018-09-07 ENCOUNTER — Encounter (HOSPITAL_COMMUNITY): Payer: Self-pay | Admitting: Surgery

## 2018-09-07 LAB — BASIC METABOLIC PANEL
Anion gap: 7 (ref 5–15)
BUN: 8 mg/dL (ref 8–23)
CO2: 25 mmol/L (ref 22–32)
CREATININE: 0.73 mg/dL (ref 0.44–1.00)
Calcium: 8.5 mg/dL — ABNORMAL LOW (ref 8.9–10.3)
Chloride: 107 mmol/L (ref 98–111)
GFR calc Af Amer: >60 mL/min (ref >60)
Glucose, Bld: 112 mg/dL — ABNORMAL HIGH (ref 70–99)
Potassium: 4 mmol/L (ref 3.5–5.1)
Sodium: 139 mmol/L (ref 135–145)

## 2018-09-07 LAB — CBC
HCT: 33.7 % — ABNORMAL LOW (ref 36.0–46.0)
Hemoglobin: 10.9 g/dL — ABNORMAL LOW (ref 12.0–15.0)
MCH: 28.5 pg (ref 26.0–34.0)
MCHC: 32.3 g/dL (ref 30.0–36.0)
MCV: 88 fL (ref 80.0–100.0)
Platelets: 235 10*3/uL (ref 150–400)
RBC: 3.83 MIL/uL — ABNORMAL LOW (ref 3.87–5.11)
RDW: 12.3 % (ref 11.5–15.5)
WBC: 15.4 10*3/uL — ABNORMAL HIGH (ref 4.0–10.5)
nRBC: 0 % (ref 0.0–0.2)

## 2018-09-07 LAB — MAGNESIUM: Magnesium: 1.6 mg/dL — ABNORMAL LOW (ref 1.7–2.4)

## 2018-09-07 NOTE — Progress Notes (Signed)
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  Advance diet.  Anxiolysis.  Insomnia treatment.  -VTE prophylaxis- SCDs, etc -mobilize as tolerated to help recovery  20 minutes spent in review, evaluation, examination, counseling, and coordination of care.  More than 50% of that time was spent in counseling.  09/07/2018    Subjective: (Chief complaint)  Denies much pain.  Already walking hallways.  Tolerated liquids.  Denies nausea or vomiting.  Objective:  Vital signs:  Vitals:   09/06/18 2110 09/06/18 2300 09/07/18 0235 09/07/18 0507  BP: (!) 148/83 (!) 148/88 (!) 122/103 137/81  Pulse: (!) 104 (!) 102 94 90  Resp: 16 16 16 16   Temp: 98.3 F (36.8 C) 98.4 F (36.9 C) 98.5 F (36.9 C) 98.8 F (37.1 C)  TempSrc: Oral Axillary Oral Oral  SpO2: 99% 100% 100% 95%  Weight:      Height:           Intake/Output   Yesterday:  01/22 0701 - 01/23 0700 In: 1800 [I.V.:1800] Out: 1100  [Urine:1000; Blood:100] This shift:  Total I/O In: 0  Out: 700 [Urine:700]  Bowel function:  Flatus: No  BM:  No  Drain: (No drain)   Physical Exam:  General: Pt awake/alert/oriented x4 in no acute distress Eyes: PERRL, normal EOM.  Sclera clear.  No icterus Neuro: CN II-XII intact w/o focal sensory/motor deficits. Lymph: No head/neck/groin lymphadenopathy Psych:  No delerium/psychosis/paranoia HENT: Normocephalic, Mucus membranes moist.  No thrush Neck: Supple, No tracheal deviation Chest: No chest wall pain w good excursion CV:  Pulses intact.  Regular rhythm MS: Normal AROM mjr joints.  No obvious deformity  Abdomen: Soft.  Nondistended.  Mildly tender at incisions only.  No evidence of peritonitis.  No incarcerated hernias.  Ext:  No deformity.  No mjr edema.  No cyanosis Skin: No petechiae / purpura  Results:   Labs: Results for orders placed or performed during the hospital encounter of 09/06/18 (from the past 48 hour(s))  CBC     Status: Abnormal   Collection Time: 09/07/18  5:58 AM  Result Value Ref Range   WBC 15.4 (H) 4.0 - 10.5 K/uL   RBC 3.83 (L) 3.87 - 5.11 MIL/uL   Hemoglobin 10.9 (L) 12.0 - 15.0 g/dL   HCT 33.7 (L) 36.0 - 46.0 %   MCV 88.0 80.0 - 100.0 fL   MCH 28.5 26.0 - 34.0 pg   MCHC 32.3 30.0 - 36.0 g/dL   RDW 12.3 11.5 - 15.5 %   Platelets 235 150 - 400 K/uL   nRBC  0.0 0.0 - 0.2 %    Comment: Performed at [facility], [place] [address] [place], [place] [address]    Imaging / Studies: No results found.  Medications / Allergies: per chart  Antibiotics: Anti-infectives (From admission, onward)   Start     Dose/Rate Route Frequency Ordered Stop   09/06/18 2200  cefoTEtan (CEFOTAN) 2 g in sodium chloride 0.9 % 100 mL IVPB     2 g 200 mL/hr over 30 Minutes Intravenous Every 12 hours 09/06/18 1711 09/06/18 2248   09/06/18 1505  clindamycin (CLEOCIN) 900 mg, gentamicin (GARAMYCIN) 240 mg in sodium chloride 0.9 % 1,000 mL for  intraperitoneal lavage  Status:  Discontinued       As needed 09/06/18 1516 09/06/18 1555   09/06/18 1400  neomycin (MYCIFRADIN) tablet 1,000 mg  Status:  Discontinued     1,000 mg Oral 3 times per day 09/06/18 1022 09/06/18 1024   09/06/18 1400  metroNIDAZOLE (FLAGYL) tablet 1,000 mg  Status:  Discontinued     1,000 mg Oral 3 times per day 09/06/18 1022 09/06/18 1024   09/06/18 1030  cefoTEtan (CEFOTAN) 2 g in sodium chloride 0.9 % 100 mL IVPB     2 g 200 mL/hr over 30 Minutes Intravenous On call to O.R. 09/06/18 1022 09/06/18 1300   09/06/18 0600  clindamycin (CLEOCIN) 900 mg, gentamicin (GARAMYCIN) 240 mg in sodium chloride 0.9 % 1,000 mL for intraperitoneal lavage  Status:  Discontinued      Intraperitoneal To Surgery 09/05/18 1009 09/06/18 1711        Note: Portions of this report may have been transcribed using voice recognition software. Every effort was made to ensure accuracy; however, inadvertent computerized transcription errors may be present.   Any transcriptional errors that result from this process are unintentional.     Adin Hector, MD, FACS, MASCRS Gastrointestinal and Minimally Invasive Surgery    [address] [employer] [place], [place] [address] [phone] Main / Paging [phone] Fax

## 2018-09-08 MED ORDER — TRAMADOL HCL 50 MG PO TABS: 50.0000 mg | ORAL_TABLET | Freq: Four times a day (QID) | ORAL | 0 refills | Status: DC | PRN

## 2018-09-08 MED ORDER — PSYLLIUM 95 % PO PACK: 1.0000 | PACK | Freq: Two times a day (BID) | ORAL | Status: DC

## 2018-09-08 NOTE — Discharge Instructions (Signed)
SURGERY: POST OP INSTRUCTIONS °(Surgery for small bowel obstruction, colon resection, etc) ° ° °###################################################################### ° °EAT °Gradually transition to a high fiber diet with a fiber supplement over the next few days after discharge ° °WALK °Walk an hour a day.  Control your pain to do that.   ° °CONTROL PAIN °Control pain so that you can walk, sleep, tolerate sneezing/coughing, go up/down stairs. ° °HAVE A BOWEL MOVEMENT DAILY °Keep your bowels regular to avoid problems.  OK to try a laxative to override constipation.  OK to use an antidairrheal to slow down diarrhea.  Call if not better after 2 tries ° °CALL IF YOU HAVE PROBLEMS/CONCERNS °Call if you are still struggling despite following these instructions. °Call if you have concerns not answered by these instructions ° °###################################################################### ° ° °DIET °Follow a light diet the first few days at home.  Start with a bland diet such as soups, liquids, starchy foods, low fat foods, etc.  If you feel full, bloated, or constipated, stay on a ful liquid or pureed/blenderized diet for a few days until you feel better and no longer constipated. °Be sure to drink plenty of fluids every day to avoid getting dehydrated (feeling dizzy, not urinating, etc.). °Gradually add a fiber supplement to your diet over the next week.  Gradually get back to a regular solid diet.  Avoid fast food or heavy meals the first week as you are more likely to get nauseated. °It is expected for your digestive tract to need a few months to get back to normal.  It is common for your bowel movements and stools to be irregular.  You will have occasional bloating and cramping that should eventually fade away.  Until you are eating solid food normally, off all pain medications, and back to regular activities; your bowels will not be normal. °Focus on eating a low-fat, high fiber diet the rest of your life  (See Getting to Good Bowel Health, below). ° °CARE of your INCISION or WOUND °It is good for closed incision and even open wounds to be washed every day.  Shower every day.  Short baths are fine.  Wash the incisions and wounds clean with soap & water.    °If you have a closed incision(s), wash the incision with soap & water every day.  You may leave closed incisions open to air if it is dry.   You may cover the incision with clean gauze & replace it after your daily shower for comfort. °If you have skin tapes (Steristrips) or skin glue (Dermabond) on your incision, leave them in place.  They will fall off on their own like a scab.  You may trim any edges that curl up with clean scissors.  If you have staples, set up an appointment for them to be removed in the office in 10 days after surgery.  °If you have a drain, wash around the skin exit site with soap & water and place a new dressing of gauze or band aid around the skin every day.  Keep the drain site clean & dry.    °If you have an open wound with packing, see wound care instructions.  In general, it is encouraged that you remove your dressing and packing, shower with soap & water, and replace your dressing once a day.  Pack the wound with clean gauze moistened with normal (0.9%) saline to keep the wound moist & uninfected.  Pressure on the dressing for 30 minutes will stop most wound   bleeding.  Eventually your body will heal & pull the open wound closed over the next few months.  °Raw open wounds will occasionally bleed or secrete yellow drainage until it heals closed.  Drain sites will drain a little until the drain is removed.  Even closed incisions can have mild bleeding or drainage the first few days until the skin edges scab over & seal.   °If you have an open wound with a wound vac, see wound vac care instructions. ° ° ° ° °ACTIVITIES as tolerated °Start light daily activities --- self-care, walking, climbing stairs-- beginning the day after surgery.   Gradually increase activities as tolerated.  Control your pain to be active.  Stop when you are tired.  Ideally, walk several times a day, eventually an hour a day.   °Most people are back to most day-to-day activities in a few weeks.  It takes 4-8 weeks to get back to unrestricted, intense activity. °If you can walk 30 minutes without difficulty, it is safe to try more intense activity such as jogging, treadmill, bicycling, low-impact aerobics, swimming, etc. °Save the most intensive and strenuous activity for last (Usually 4-8 weeks after surgery) such as sit-ups, heavy lifting, contact sports, etc.  Refrain from any intense heavy lifting or straining until you are off narcotics for pain control.  You will have off days, but things should improve week-by-week. °DO NOT PUSH THROUGH PAIN.  Let pain be your guide: If it hurts to do something, don't do it.  Pain is your body warning you to avoid that activity for another week until the pain goes down. °You may drive when you are no longer taking narcotic prescription pain medication, you can comfortably wear a seatbelt, and you can safely make sudden turns/stops to protect yourself without hesitating due to pain. °You may have sexual intercourse when it is comfortable. If it hurts to do something, stop. ° °MEDICATIONS °Take your usually prescribed home medications unless otherwise directed.   °Blood thinners:  °Usually you can restart any strong blood thinners after the second postoperative day.  It is OK to take aspirin right away.    ° If you are on strong blood thinners (warfarin/Coumadin, Plavix, Xerelto, Eliquis, Pradaxa, etc), discuss with your surgeon, medicine PCP, and/or cardiologist for instructions on when to restart the blood thinner & if blood monitoring is needed (PT/INR blood check, etc).   ° ° °PAIN CONTROL °Pain after surgery or related to activity is often due to strain/injury to muscle, tendon, nerves and/or incisions.  This pain is usually  short-term and will improve in a few months.  °To help speed the process of healing and to get back to regular activity more quickly, DO THE FOLLOWING THINGS TOGETHER: °1. Increase activity gradually.  DO NOT PUSH THROUGH PAIN °2. Use Ice and/or Heat °3. Try Gentle Massage and/or Stretching °4. Take over the counter pain medication °5. Take Narcotic prescription pain medication for more severe pain ° °Good pain control = faster recovery.  It is better to take more medicine to be more active than to stay in bed all day to avoid medications. °1.  Increase activity gradually °Avoid heavy lifting at first, then increase to lifting as tolerated over the next 6 weeks. °Do not “push through” the pain.  Listen to your body and avoid positions and maneuvers than reproduce the pain.  Wait a few days before trying something more intense °Walking an hour a day is encouraged to help your body recover faster   and more safely.  Start slowly and stop when getting sore.  If you can walk 30 minutes without stopping or pain, you can try more intense activity (running, jogging, aerobics, cycling, swimming, treadmill, sex, sports, weightlifting, etc.) °Remember: If it hurts to do it, then don’t do it! °2. Use Ice and/or Heat °You will have swelling and bruising around the incisions.  This will take several weeks to resolve. °Ice packs or heating pads (6-8 times a day, 30-60 minutes at a time) will help sooth soreness & bruising. °Some people prefer to use ice alone, heat alone, or alternate between ice & heat.  Experiment and see what works best for you.  Consider trying ice for the first few days to help decrease swelling and bruising; then, switch to heat to help relax sore spots and speed recovery. °Shower every day.  Short baths are fine.  It feels good!  Keep the incisions and wounds clean with soap & water.   °3. Try Gentle Massage and/or Stretching °Massage at the area of pain many times a day °Stop if you feel pain - do not  overdo it °4. Take over the counter pain medication °This helps the muscle and nerve tissues become less irritable and calm down faster °Choose ONE of the following over-the-counter anti-inflammatory medications: °Acetaminophen 500mg tabs (Tylenol) 1-2 pills with every meal and just before bedtime (avoid if you have liver problems or if you have acetaminophen in you narcotic prescription) °Naproxen 220mg tabs (ex. Aleve, Naprosyn) 1-2 pills twice a day (avoid if you have kidney, stomach, IBD, or bleeding problems) °Ibuprofen 200mg tabs (ex. Advil, Motrin) 3-4 pills with every meal and just before bedtime (avoid if you have kidney, stomach, IBD, or bleeding problems) °Take with food/snack several times a day as directed for at least 2 weeks to help keep pain / soreness down & more manageable. °5. Take Narcotic prescription pain medication for more severe pain °A prescription for strong pain control is often given to you upon discharge (for example: oxycodone/Percocet, hydrocodone/Norco/Vicodin, or tramadol/Ultram) °Take your pain medication as prescribed. °Be mindful that most narcotic prescriptions contain Tylenol (acetaminophen) as well - avoid taking too much Tylenol. °If you are having problems/concerns with the prescription medicine (does not control pain, nausea, vomiting, rash, itching, etc.), please call us [phone] to see if we need to switch you to a different pain medicine that will work better for you and/or control your side effects better. °If you need a refill on your pain medication, you must call the office before 4 pm and on weekdays only.  By federal law, prescriptions for narcotics cannot be called into a pharmacy.  They must be filled out on paper & picked up from our office by the patient or authorized caretaker.  Prescriptions cannot be filled after 4 pm nor on weekends.   ° °WHEN TO CALL US [phone] °Severe uncontrolled or worsening pain  °Fever over 101 F (38.5 C) °Concerns with  the incision: Worsening pain, redness, rash/hives, swelling, bleeding, or drainage °Reactions / problems with new medications (itching, rash, hives, nausea, etc.) °Nausea and/or vomiting °Difficulty urinating °Difficulty breathing °Worsening fatigue, dizziness, lightheadedness, blurred vision °Other concerns °If you are not getting better after two weeks or are noticing you are getting worse, contact our office [phone] for further advice.  We may need to adjust your medications, re-evaluate you in the office, send you to the emergency room, or see what other things we can do to help. °The   clinic staff is available to answer your questions during regular business hours (8:30am-5pm).  Please don’t hesitate to call and ask to speak to one of our nurses for clinical concerns.    °A surgeon from [facility] is always on call at the hospitals 24 hours/day °If you have a medical emergency, go to the nearest emergency room or call 911. ° °FOLLOW UP in our office °One the day of your discharge from the hospital (or the next business weekday), please call [facility] to set up or confirm an appointment to see your surgeon in the office for a follow-up appointment.  Usually it is 2-3 weeks after your surgery.   °If you have skin staples at your incision(s), let the office know so we can set up a time in the office for the nurse to remove them (usually around 10 days after surgery). °Make sure that you call for appointments the day of discharge (or the next business weekday) from the hospital to ensure a convenient appointment time. °IF YOU HAVE DISABILITY OR FAMILY LEAVE FORMS, BRING THEM TO THE OFFICE FOR PROCESSING.  DO NOT GIVE THEM TO YOUR DOCTOR. ° °[facility] °[address], [address], [place], [place]  [address] ? °[phone] - Main °[phone] - Toll Free,  [phone] - Fax °[email or web address] ° °GETTING TO GOOD BOWEL HEALTH. °It is  expected for your digestive tract to need a few months to get back to normal.  It is common for your bowel movements and stools to be irregular.  You will have occasional bloating and cramping that should eventually fade away.  Until you are eating solid food normally, off all pain medications, and back to regular activities; your bowels will not be normal.   °Avoiding constipation °The goal: ONE SOFT BOWEL MOVEMENT A DAY!    °Drink plenty of fluids.  Choose water first. °TAKE A FIBER SUPPLEMENT EVERY DAY THE REST OF YOUR LIFE °During your first week back home, gradually add back a fiber supplement every day °Experiment which form you can tolerate.   There are many forms such as powders, tablets, wafers, gummies, etc °Psyllium bran (Metamucil), methylcellulose (Citrucel), Miralax or Glycolax, Benefiber, Flax Seed.  °Adjust the dose week-by-week (1/2 dose/day to 6 doses a day) until you are moving your bowels 1-2 times a day.  Cut back the dose or try a different fiber product if it is giving you problems such as diarrhea or bloating. °Sometimes a laxative is needed to help jump-start bowels if constipated until the fiber supplement can help regulate your bowels.  If you are tolerating eating & you are farting, it is okay to try a gentle laxative such as double dose MiraLax, prune juice, or Milk of Magnesia.  Avoid using laxatives too often. °Stool softeners can sometimes help counteract the constipating effects of narcotic pain medicines.  It can also cause diarrhea, so avoid using for too long. °If you are still constipated despite taking fiber daily, eating solids, and a few doses of laxatives, call our office. °Controlling diarrhea °Try drinking liquids and eating bland foods for a few days to avoid stressing your intestines further. °Avoid dairy products (especially milk & ice cream) for a short time.  The intestines often can lose the ability to digest lactose when stressed. °Avoid foods that cause gassiness or  bloating.  Typical foods include beans and other legumes, cabbage, broccoli, and dairy foods.  Avoid greasy, spicy, fast foods.  Every person has   some sensitivity to other foods, so listen to your body and avoid those foods that trigger problems for you. °Probiotics (such as active yogurt, Align, etc) may help repopulate the intestines and colon with normal bacteria and calm down a sensitive digestive tract °Adding a fiber supplement gradually can help thicken stools by absorbing excess fluid and retrain the intestines to act more normally.  Slowly increase the dose over a few weeks.  Too much fiber too soon can backfire and cause cramping & bloating. °It is okay to try and slow down diarrhea with a few doses of antidiarrheal medicines.   °Bismuth subsalicylate (ex. Kayopectate, Pepto Bismol) for a few doses can help control diarrhea.  Avoid if pregnant.   °Loperamide (Imodium) can slow down diarrhea.  Start with one tablet (2mg) first.  Avoid if you are having fevers or severe pain.  °ILEOSTOMY PATIENTS WILL HAVE CHRONIC DIARRHEA since their colon is not in use.    °Drink plenty of liquids.  You will need to drink even more glasses of water/liquid a day to avoid getting dehydrated. °Record output from your ileostomy.  Expect to empty the bag every 3-4 hours at first.  Most people with a permanent ileostomy empty their bag 4-6 times at the least.   °Use antidiarrheal medicine (especially Imodium) several times a day to avoid getting dehydrated.  Start with a dose at bedtime & breakfast.  Adjust up or down as needed.  Increase antidiarrheal medications as directed to avoid emptying the bag more than 8 times a day (every 3 hours). °Work with your wound ostomy nurse to learn care for your ostomy.  See ostomy care instructions. °TROUBLESHOOTING IRREGULAR BOWELS °1) Start with a soft & bland diet. No spicy, greasy, or fried foods.  °2) Avoid gluten/wheat or dairy products from diet to see if symptoms improve. °3) Miralax  17gm or flax seed mixed in 8oz. water or juice-daily. May use 2-4 times a day as needed. °4) Gas-X, Phazyme, etc. as needed for gas & bloating.  °5) Prilosec (omeprazole) over-the-counter as needed °6)  Consider probiotics (Align, Activa, etc) to help calm the bowels down ° °Call your doctor if you are getting worse or not getting better.  Sometimes further testing (cultures, endoscopy, X-ray studies, CT scans, bloodwork, etc.) may be needed to help diagnose and treat the cause of the diarrhea. °[facility] °[address], [address], [place], [place]  [address] °[phone] - Main.    °[phone]  - Toll Free.   [phone] - Fax °[email or web address] ° ° ° °Diverticulitis ° °Diverticulitis is infection or inflammation of small pouches (diverticula) in the colon that form due to a condition called diverticulosis. Diverticula can trap stool (feces) and bacteria, causing infection and inflammation. °Diverticulitis may cause severe stomach pain and diarrhea. It may lead to tissue damage in the colon that causes bleeding. The diverticula may also burst (rupture) and cause infected stool to enter other areas of the abdomen. °Complications of diverticulitis can include: °· Bleeding. °· Severe infection. °· Severe pain. °· Rupture (perforation) of the colon. °· Blockage (obstruction) of the colon. °What are the causes? °This condition is caused by stool becoming trapped in the diverticula, which allows bacteria to grow in the diverticula. This leads to inflammation and infection. °What increases the risk? °You are more likely to develop this condition if: °· You have diverticulosis. The risk for diverticulosis increases if: °? You are overweight or obese. °? You use tobacco products. °? You   do not get enough exercise. °· You eat a diet that does not include enough fiber. High-fiber foods include fruits, vegetables, beans, nuts, and whole grains. °What are the signs or  symptoms? °Symptoms of this condition may include: °· Pain and tenderness in the abdomen. The pain is normally located on the left side of the abdomen, but it may occur in other areas. °· Fever and chills. °· Bloating. °· Cramping. °· Nausea. °· Vomiting. °· Changes in bowel routines. °· Blood in your stool. °How is this diagnosed? °This condition is diagnosed based on: °· Your medical history. °· A physical exam. °· Tests to make sure there is nothing else causing your condition. These tests may include: °? Blood tests. °? Urine tests. °? Imaging tests of the abdomen, including X-rays, ultrasounds, MRIs, or CT scans. °How is this treated? °Most cases of this condition are mild and can be treated at home. Treatment may include: °· Taking over-the-counter pain medicines. °· Following a clear liquid diet. °· Taking antibiotic medicines by mouth. °· Rest. °More severe cases may need to be treated at a hospital. Treatment may include: °· Not eating or drinking. °· Taking prescription pain medicine. °· Receiving antibiotic medicines through an IV tube. °· Receiving fluids and nutrition through an IV tube. °· Surgery. °When your condition is under control, your health care provider may recommend that you have a colonoscopy. This is an exam to look at the entire large intestine. During the exam, a lubricated, bendable tube is inserted into the anus and then passed into the rectum, colon, and other parts of the large intestine. A colonoscopy can show how severe your diverticula are and whether something else may be causing your symptoms. °Follow these instructions at home: °Medicines °· Take over-the-counter and prescription medicines only as told by your health care provider. These include fiber supplements, probiotics, and stool softeners. °· If you were prescribed an antibiotic medicine, take it as told by your health care provider. Do not stop taking the antibiotic even if you start to feel better. °· Do not drive or  use heavy machinery while taking prescription pain medicine. °General instructions ° °· Follow a full liquid diet or another diet as directed by your health care provider. After your symptoms improve, your health care provider may tell you to change your diet. He or she may recommend that you eat a diet that contains at least 25 g (25 grams) of fiber daily. Fiber makes it easier to pass stool. Healthy sources of fiber include: °? Berries. One cup contains 4-8 grams of fiber. °? Beans or lentils. One half cup contains 5-8 grams of fiber. °? Green vegetables. One cup contains 4 grams of fiber. °· Exercise for at least 30 minutes, 3 times each week. You should exercise hard enough to raise your heart rate and break a sweat. °· Keep all follow-up visits as told by your health care provider. This is important. You may need a colonoscopy. °Contact a health care provider if: °· Your pain does not improve. °· You have a hard time drinking or eating food. °· Your bowel movements do not return to normal. °Get help right away if: °· Your pain gets worse. °· Your symptoms do not get better with treatment. °· Your symptoms suddenly get worse. °· You have a fever. °· You vomit more than one time. °· You have stools that are bloody, black, or tarry. °Summary °· Diverticulitis is infection or inflammation of small pouches (diverticula) in the   colon that form due to a condition called diverticulosis. Diverticula can trap stool (feces) and bacteria, causing infection and inflammation. °· You are at higher risk for this condition if you have diverticulosis and you eat a diet that does not include enough fiber. °· Most cases of this condition are mild and can be treated at home. More severe cases may need to be treated at a hospital. °· When your condition is under control, your health care provider may recommend that you have an exam called a colonoscopy. This exam can show how severe your diverticula are and whether something else  may be causing your symptoms. °This information is not intended to replace advice given to you by your health care provider. Make sure you discuss any questions you have with your health care provider. °Document Released: 05/12/2005 Document Revised: 09/04/2016 Document Reviewed: 09/04/2016 °Elsevier Interactive Patient Education © 2019 [employer] ° °

## 2018-09-08 NOTE — Care Management Important Message (Signed)
Important Message  Patient Details  Name: Rebekah Schmidt MRN: [id number] Date of Birth: 1951-03-12   Medicare Important Message Given:  Yes    Kerin Salen 09/08/2018, 10:53 AMImportant Message  Patient Details  Name: Rebekah Schmidt MRN: [id number] Date of Birth: 06/23/1951   Medicare Important Message Given:  Yes    Kerin Salen 09/08/2018, 10:53 AM

## 2018-09-08 NOTE — Discharge Summary (Signed)
Physician Discharge Summary    Patient ID: Rebekah Schmidt MRN: [id number] DOB/AGE: 1950/12/29  68 y.o.  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  Underwent gastroneurology and surgical evaluations.  Surgery recommended.  The patient underwent the surgery above.  Postoperatively, the patient gradually mobilized and advanced to a solid diet.  Pain and other symptoms were treated aggressively.    By the time of discharge, the patient was walking well the hallways, eating food, having flatus.  Pain was well-controlled on an oral medications.  Based on meeting discharge criteria and continuing to recover, I felt it was safe for the patient to be discharged from the hospital to further recover with close followup. Postoperative recommendations were discussed in detail.  They are written as well.  Discharged Condition: good  Discharge Exam: Blood pressure 133/77, pulse 92, temperature 97.9 F (36.6 C), temperature source Oral, resp. rate 18, height 5\' 6"  (1.676 m), weight 66.7 kg, SpO2 96 %.  General: Pt awake/alert/oriented x4 in No acute  distress Eyes: PERRL, normal EOM.  Sclera clear.  No icterus Neuro: CN II-XII intact w/o focal sensory/motor deficits. Lymph: No head/neck/groin lymphadenopathy Psych:  No delerium/psychosis/paranoia HENT: Normocephalic, Mucus membranes moist.  No thrush Neck: Supple, No tracheal deviation Chest: No chest wall pain w good excursion CV:  Pulses intact.  Regular rhythm MS: Normal AROM mjr joints.  No obvious deformity Abdomen: Soft.  Nondistended.  Nontender.  No evidence of peritonitis.  No incarcerated hernias. Ext:  SCDs BLE.  No mjr edema.  No cyanosis Skin: No petechiae / purpura   Disposition:   Follow-up Information    Michael Boston, MD. Schedule an appointment as soon as possible for a visit in 2 weeks.   Specialty:  General Surgery Why:  To follow up after your operation, To follow up after your hospital stay Contact information: [place] [address] [phone]           Discharge disposition: 01-Home or Self Care       Discharge Instructions    Call MD for:   Complete by:  As directed    FEVER > 101.5 F  (temperatures < 101.5 F are not significant)   Call MD for:  extreme fatigue   Complete by:  As directed    Call MD for:  persistant dizziness or light-headedness   Complete by:  As directed    Call MD for:  persistant nausea and vomiting   Complete by:  As directed    Call MD for:  redness, tenderness, or signs of infection (pain, swelling, redness, odor or green/yellow discharge around incision site)   Complete by:  As directed    Call MD for:  severe uncontrolled  pain   Complete by:  As directed    Diet - low sodium heart healthy   Complete by:  As directed    Start with a bland diet such as soups, liquids, starchy foods, low fat foods, etc. the first few days at home. Gradually advance to a solid, low-fat, high fiber diet by the end of the first week at home.   Add a fiber supplement to your diet (Metamucil, etc) If  you feel full, bloated, or constipated, stay on a full liquid or pureed/blenderized diet for a few days until you feel better and are no longer constipated.   Discharge instructions   Complete by:  As directed    See Discharge Instructions If you are not getting better after two weeks or are noticing you are getting worse, contact our office [phone] for further advice.  We may need to adjust your medications, re-evaluate you in the office, send you to the emergency room, or see what other things we can do to help. The clinic staff is available to answer your questions during regular business hours (8:30am-5pm).  Please don't hesitate to call and ask to speak to one of our nurses for clinical concerns.    A surgeon from [facility] is always on call at the hospitals 24 hours/day If you have a medical emergency, go to the nearest emergency room or call 911.   Discharge wound care:   Complete by:  As directed    It is good for closed incisions and even open wounds to be washed every day.  Shower every day.  Short baths are fine.  Wash the incisions and wounds clean with soap & water.     You may leave closed incisions open to air if it is dry.   You may cover the incision with clean gauze & replace it after your daily shower for comfort.   If you have skin tapes (Steristrips) or skin glue (Dermabond) on your incision, leave them in place.  They will fall off on their own like a scab.  You may trim any edges that curl up with clean scissors.    If you have skin staples, set up an appointment for them to be removed in the office in 10 days after surgery.  If you have a drain, wash around the skin exit site with soap & water and place a new dressing of gauze or band aid around the skin every day.  Keep the drain site clean & dry.   Driving Restrictions   Complete by:  As directed    You may drive when: - you are no longer taking narcotic prescription pain medication - you can  comfortably wear a seatbelt - you can safely make sudden turns/stops without pain.   Increase activity slowly   Complete by:  As directed    Start light daily activities --- self-care, walking, climbing stairs- beginning the day after surgery.  Gradually increase activities as tolerated.  Control your pain to be active.  Stop when you are tired.  Ideally, walk several times a day, eventually an hour a day.   Most people are back to most day-to-day activities in a few weeks.  It takes 4-6 weeks to get back to unrestricted, intense activity. If you can walk 30 minutes without difficulty, it is safe to try more intense activity such as jogging, treadmill, bicycling, low-impact aerobics, swimming, etc. Save the most intensive and strenuous activity for last (Usually 4-8 weeks  after surgery) such as sit-ups, heavy lifting, contact sports, etc.  Refrain from any intense heavy lifting or straining until you are off narcotics for pain control.  You will have off days, but things should improve week-by-week. DO NOT PUSH THROUGH PAIN.  Let pain be your guide: If it hurts to do something, don't do it.   Lifting restrictions   Complete by:  As directed    If you can walk 30 minutes without difficulty, it is safe to try more intense activity such as jogging, treadmill, bicycling, low-impact aerobics, swimming, etc. Save the most intensive and strenuous activity for last (Usually 4-8 weeks after surgery) such as sit-ups, heavy lifting, contact sports, etc.   Refrain from any intense heavy lifting or straining until you are off narcotics for pain control.  You will have off days, but things should improve week-by-week. DO NOT PUSH THROUGH PAIN.  Let pain be your guide: If it hurts to do something, don't do it.  Pain is your body warning you to avoid that activity for another week until the pain goes down.   May shower / Bathe   Complete by:  As directed    May walk up steps   Complete by:  As directed    Sexual  Activity Restrictions   Complete by:  As directed    You may have sexual intercourse when it is comfortable. If it hurts to do something, stop.      Allergies as of 09/08/2018      Reactions   Morphine And Related Itching      Medication List    STOP taking these medications   FLEET LIQUID GLYCERIN SUPP RE     TAKE these medications   FLEXERIL PO Take 5 mg by mouth 3 (three) times daily as needed (muscle spasms).   hyoscyamine 0.125 MG tablet Commonly known as:  LEVSIN, ANASPAZ Take 0.125 mg by mouth daily.   ibuprofen 200 MG tablet Commonly known as:  ADVIL,MOTRIN Take 400 mg by mouth every 6 (six) hours as needed for pain.   LORazepam 0.5 MG tablet Commonly known as:  ATIVAN Take 0.5 mg by mouth 3 (three) times daily as needed for anxiety.   Melatonin 5 MG Tabs Take 5 mg by mouth at bedtime as needed (sleep).   oxymetazoline 0.05 % nasal spray Commonly known as:  AFRIN Place 1 spray into both nostrils 2 (two) times daily as needed for congestion.   pantoprazole 20 MG tablet Commonly known as:  PROTONIX Take 1 tablet (20 mg total) by mouth 2 (two) times daily before a meal. Breakfast and supper What changed:  when to take this   SYSTANE OP Place 1 drop into both eyes daily.   traMADol 50 MG tablet Commonly known as:  ULTRAM Take 1-2 tablets (50-100 mg total) by mouth every 6 (six) hours as needed for moderate pain or severe pain (mild pain).   zolpidem 10 MG tablet Commonly known as:  AMBIEN Take 10 mg by mouth at bedtime.            Discharge Care Instructions  (From admission, onward)         Start     Ordered   09/08/18 0000  Discharge wound care:    Comments:  It is good for closed incisions and even open wounds to be washed every day.  Shower every day.  Short baths are fine.  Wash the incisions and wounds clean with soap & water.  You may leave closed incisions open to air if it is dry.   You may cover the incision with clean gauze &  replace it after your daily shower for comfort.   If you have skin tapes (Steristrips) or skin glue (Dermabond) on your incision, leave them in place.  They will fall off on their own like a scab.  You may trim any edges that curl up with clean scissors.    If you have skin staples, set up an appointment for them to be removed in the office in 10 days after surgery.  If you have a drain, wash around the skin exit site with soap & water and place a new dressing of gauze or band aid around the skin every day.  Keep the drain site clean & dry.   09/08/18 0734          Significant Diagnostic Studies:  Results for orders placed or performed during the hospital encounter of 09/06/18 (from the past 72 hour(s))  Basic metabolic panel     Status: Abnormal   Collection Time: 09/07/18  5:58 AM  Result Value Ref Range   Sodium 139 135 - 145 mmol/L   Potassium 4.0 3.5 - 5.1 mmol/L   Chloride 107 98 - 111 mmol/L   CO2 25 22 - 32 mmol/L   Glucose, Bld 112 (H) 70 - 99 mg/dL   BUN 8 8 - 23 mg/dL   Creatinine, Ser 0.73 0.44 - 1.00 mg/dL   Calcium 8.5 (L) 8.9 - 10.3 mg/dL   GFR calc non Af Amer >60 >60 mL/min   GFR calc Af Amer >60 >60 mL/min   Anion gap 7 5 - 15    Comment: Performed at [facility], [place] [address] [place], [place] [address]  CBC     Status: Abnormal   Collection Time: 09/07/18  5:58 AM  Result Value Ref Range   WBC 15.4 (H) 4.0 - 10.5 K/uL   RBC 3.83 (L) 3.87 - 5.11 MIL/uL   Hemoglobin 10.9 (L) 12.0 - 15.0 g/dL   HCT 33.7 (L) 36.0 - 46.0 %   MCV 88.0 80.0 - 100.0 fL   MCH 28.5 26.0 - 34.0 pg   MCHC 32.3 30.0 - 36.0 g/dL   RDW 12.3 11.5 - 15.5 %   Platelets 235 150 - 400 K/uL   nRBC 0.0 0.0 - 0.2 %    Comment: Performed at [facility], [place] [address], [place], [place] [address]  Magnesium     Status: Abnormal   Collection Time: 09/07/18  5:58 AM  Result Value Ref Range   Magnesium 1.6 (L) 1.7 - 2.4 mg/dL    Comment: Performed at  [facility], [place] [address], [place], [place] [address]    No results found.  Past Medical History:  Diagnosis Date  . Allergic rhinitis   . Allergy   . Anxiety   . Arthritis   . Benign fundic gland polyps of stomach   . History of kidney stones   . Insomnia   . Morton's neuroma of right foot 03/28/2017  . MVP (mitral valve prolapse)   . Neck pain 12/22/2016  . Osteoporosis   . 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 Walking pneumonia     Past Surgical History:  Procedure Laterality Date  . ABDOMINAL HYSTERECTOMY  1999  . ANKLE SURGERY     left ankle with 2 plates and screws  . APPENDECTOMY    .  CHOLECYSTECTOMY    . COLONOSCOPY  2007   Negative  . ESOPHAGOGASTRODUODENOSCOPY  04/2011   Hiatal hernia with esophagitis no Barrett's, repeat 2018 - fundic gland polyps  . PROCTOSCOPY N/A 09/06/2018   Procedure: RIGID PROCTOSCOPY;  Surgeon: Michael Boston, MD;  Location: [facility];  Service: General;  Laterality: N/A;  . TUBAL LIGATION    . UPPER GASTROINTESTINAL ENDOSCOPY      Social History   Socioeconomic History  . Marital status: Married    Spouse name: Not on file  . Number of children: 1  . Years of education: Not on file  . Highest education level: Not on file  Occupational History  . Occupation: retired  Scientific laboratory technician  . Financial resource strain: Not on file  . Food insecurity:    Worry: Not on file    Inability: Not on file  . Transportation needs:    Medical: Not on file    Non-medical: Not on file  Tobacco Use  . Smoking status: Never Smoker  . Smokeless tobacco: Never Used  Substance and Sexual Activity  . Alcohol use: Yes    Comment: rare  . Drug use: No  . Sexual activity: Not Currently    Partners: Male    Birth control/protection: Surgical  Lifestyle  . Physical activity:    Days per week: Not on file    Minutes per session: Not on file  . Stress: Not on file  Relationships  . Social connections:    Talks on  phone: Not on file    Gets together: Not on file    Attends religious service: Not on file    Active member of club or organization: Not on file    Attends meetings of clubs or organizations: Not on file    Relationship status: Not on file  . Intimate partner violence:    Fear of current or ex partner: Not on file    Emotionally abused: Not on file    Physically abused: Not on file    Forced sexual activity: Not on file  Other Topics Concern  . Not on file  Social History Narrative   Married and lives with her husband in [place], her one daughter is a Marine scientist in [place].   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 Stomach cancer Neg Hx   . Colon cancer Neg Hx   . Colon polyps Neg Hx   . Esophageal cancer Neg Hx   . Rectal cancer Neg Hx     Current Facility-Administered Medications  Medication Dose Route Frequency Provider Last Rate Last Dose  . 0.9 %  sodium chloride infusion  1,000 mL Intravenous Q8H PRN Michael Boston, MD   Stopped at 09/07/18 [id number]  . acetaminophen (TYLENOL) tablet 1,000 mg  1,000 mg Oral Lajuana Ripple, MD   1,000 mg at 09/08/18 0522  . alum & mag hydroxide-simeth (MAALOX/MYLANTA) 200-200-20 MG/5ML suspension 30 mL  30 mL Oral Q6H PRN Michael Boston, MD      . alvimopan (ENTEREG) capsule 12 mg  12 mg Oral BID Michael Boston, MD   12 mg at 09/07/18 2143  . diphenhydrAMINE (  BENADRYL) 12.5 MG/5ML elixir 12.5 mg  12.5 mg Oral Q6H PRN Michael Boston, MD       Or  . diphenhydrAMINE (BENADRYL) injection 12.5 mg  12.5 mg Intravenous Q6H PRN Michael Boston, MD      . enoxaparin (LOVENOX) injection 40 mg  40 mg Subcutaneous Q24H Michael Boston, MD   40 mg at 09/07/18 6659  . feeding supplement (ENSURE  SURGERY) liquid 237 mL  237 mL Oral BID BM Michael Boston, MD      . gabapentin (NEURONTIN) capsule 300 mg  300 mg Oral BID Michael Boston, MD   300 mg at 09/07/18 2144  . hydrALAZINE (APRESOLINE) injection 10 mg  10 mg Intravenous Q2H PRN Michael Boston, MD      . HYDROmorphone (DILAUDID) injection 0.5-2 mg  0.5-2 mg Intravenous Q4H PRN Michael Boston, MD   0.5 mg at 09/06/18 2250  . lactated ringers infusion   Intravenous Continuous Michael Boston, MD 50 mL/hr at 09/06/18 1047    . lip balm (CARMEX) ointment 1 application  1 application Topical BID Michael Boston, MD   1 application at 93/57/01 2201  . LORazepam (ATIVAN) tablet 0.5 mg  0.5 mg Oral TID PRN Michael Boston, MD      . magic mouthwash  15 mL Oral QID PRN Michael Boston, MD      . Melatonin TABS 5 mg  5 mg Oral QHS PRN Michael Boston, MD      . methocarbamol (ROBAXIN) tablet 1,000 mg  1,000 mg Oral Q6H PRN Michael Boston, MD   1,000 mg at 09/07/18 2152  . metoCLOPramide (REGLAN) injection 10 mg  10 mg Intravenous Q6H PRN Michael Boston, MD      . metoprolol tartrate (LOPRESSOR) injection 5 mg  5 mg Intravenous Q6H PRN Michael Boston, MD      . ondansetron [facility]) tablet 4 mg  4 mg Oral Q6H PRN Michael Boston, MD       Or  . ondansetron [facility]) injection 4 mg  4 mg Intravenous Q6H PRN Michael Boston, MD   4 mg at 09/06/18 2250  . oxymetazoline (AFRIN) 0.05 % nasal spray 1 spray  1 spray Each Nare BID PRN Michael Boston, MD      . pantoprazole (PROTONIX) EC tablet 20 mg  20 mg Oral Ardeen Fillers, MD   20 mg at 09/07/18 2152  . prochlorperazine (COMPAZINE) tablet 10 mg  10 mg Oral Q6H PRN Michael Boston, MD       Or  . prochlorperazine (COMPAZINE) injection 5-10 mg  5-10 mg Intravenous Q6H PRN Michael Boston, MD      . psyllium (HYDROCIL/METAMUCIL) packet 1 packet  1 packet Oral BID Michael Boston, MD      . saccharomyces boulardii (FLORASTOR) capsule 250 mg  250 mg Oral BID Michael Boston, MD   250 mg at 09/07/18 2144  . scopolamine  (TRANSDERM-SCOP) 1 MG/3DAYS 1.5 mg  1 patch Transdermal Cyndia Bent, MD   1.5 mg at 09/06/18 1059  . traMADol (ULTRAM) tablet 50-100 mg  50-100 mg Oral Q6H PRN Michael Boston, MD   100 mg at 09/07/18 2355  . zolpidem (AMBIEN) tablet 10 mg  10 mg Oral Ardeen Fillers, MD   10 mg at 09/07/18 2359     Allergies  Allergen Reactions  . Morphine And Related Itching    Signed: [place] Peters, MD, FACS, MASCRS Gastrointestinal and Minimally Invasive Surgery    [address]  [address] [place], [place] [address] [phone] Main / Paging [phone] Fax   09/08/2018, 7:34 AM

## 2018-09-14 NOTE — Progress Notes (Signed)
Please enter a recall for Office visit October 2020

## 2019-04-19 ENCOUNTER — Other Ambulatory Visit: Payer: Self-pay | Admitting: Internal Medicine

## 2019-05-28 ENCOUNTER — Other Ambulatory Visit: Payer: Self-pay | Admitting: Internal Medicine

## 2020-04-17 ENCOUNTER — Other Ambulatory Visit: Payer: Self-pay | Admitting: Internal Medicine

## 2020-04-25 IMAGING — CT CT ABD-PELV W/ CM
2 of 5 series · 16 of 46 positions shown, 18 images · IV contrast (ISOVUE)
Comparison: None.

CLINICAL DATA: Abdominal pain following colonoscopy

EXAM:
CT ABDOMEN AND PELVIS WITH CONTRAST
TECHNIQUE: Multidetector CT imaging of the abdomen and pelvis was performed
using the standard protocol following bolus administration of
intravenous contrast.
CONTRAST:  100mL 3CNMG0-4DD IOPAMIDOL (3CNMG0-4DD) INJECTION 61%

[Series 2: axial st · axial · 0.77mm/px · z∈[-588,-228]mm · 13 of 86 slices shown, 15 images]
[im 7/86  soft-tissue]
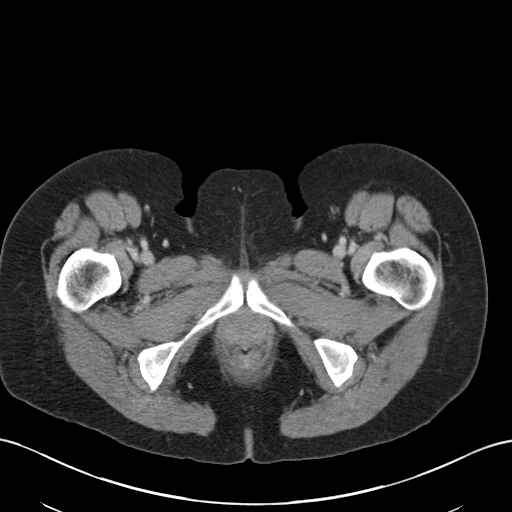
[im 7/86  bone]
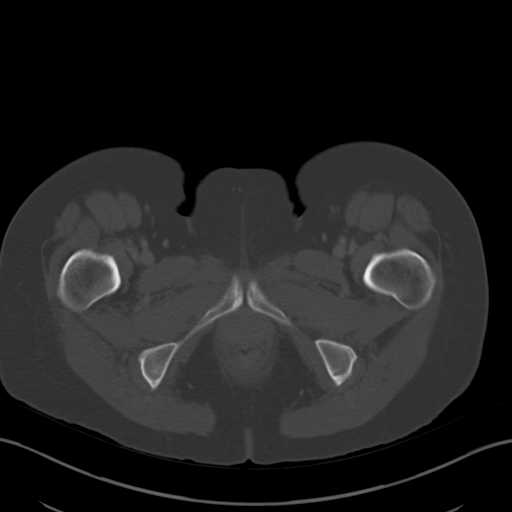
[im 13/86  soft-tissue]
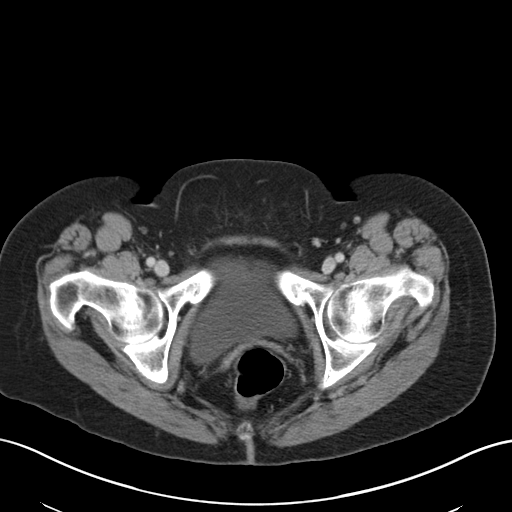
[im 19/86  soft-tissue]
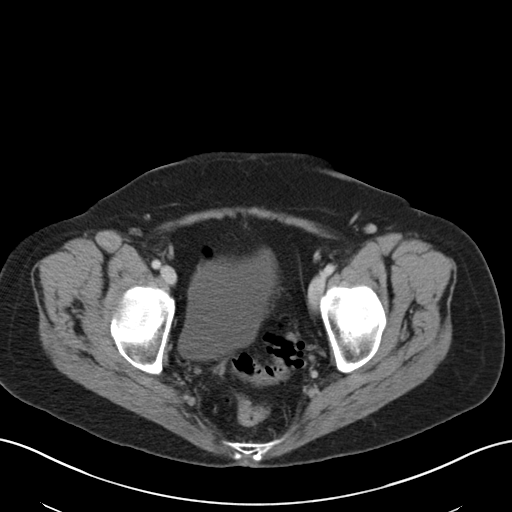
[im 25/86  soft-tissue]
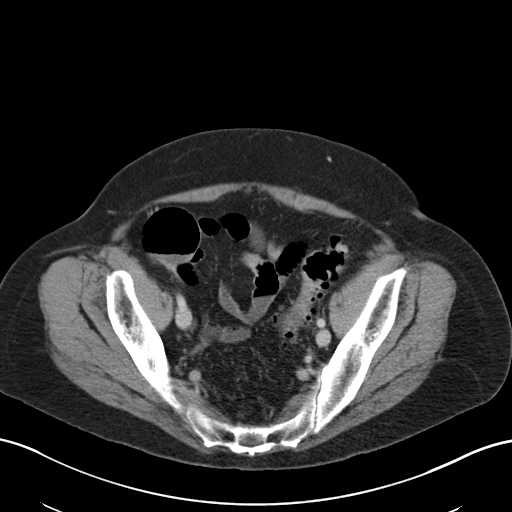
[im 31/86  soft-tissue]
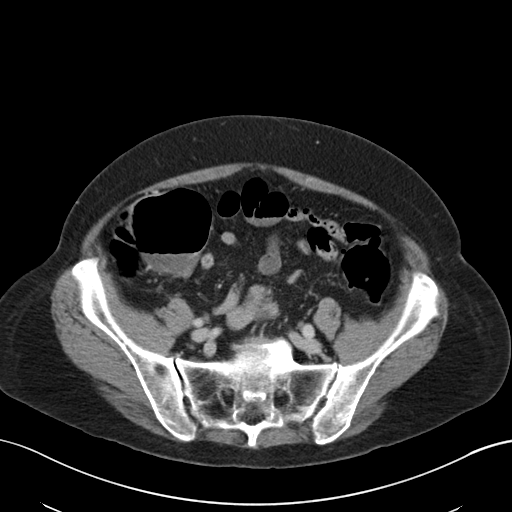
[im 37/86  soft-tissue]
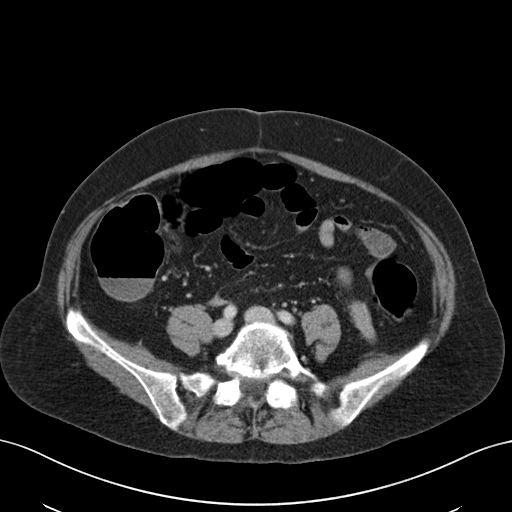
[im 43/86  soft-tissue]
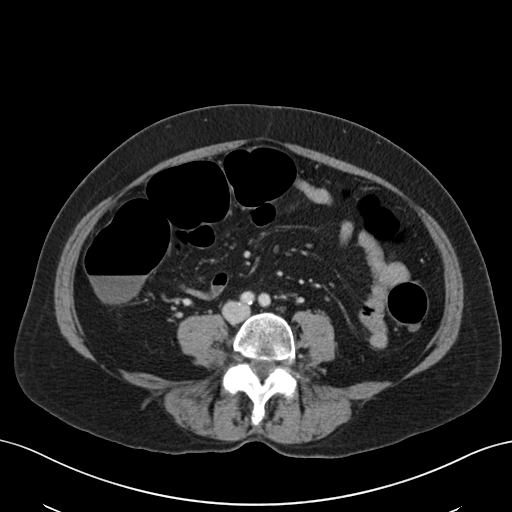
[im 49/86  soft-tissue]
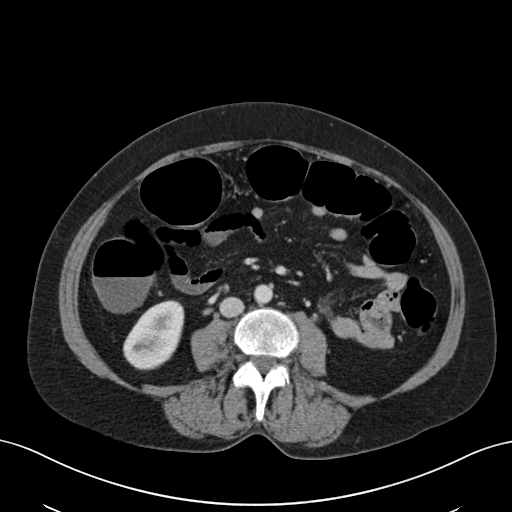
[im 55/86  soft-tissue]
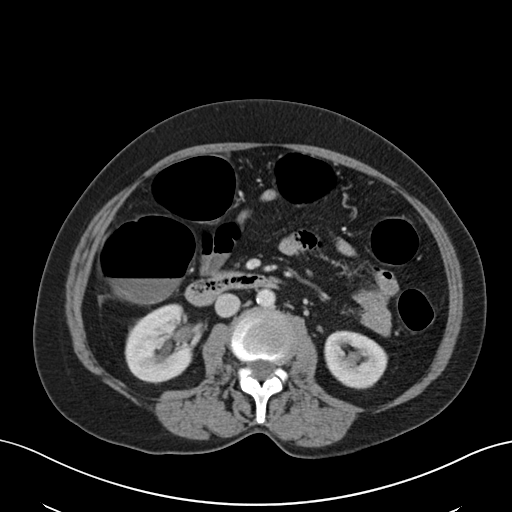
[im 55/86  bone]
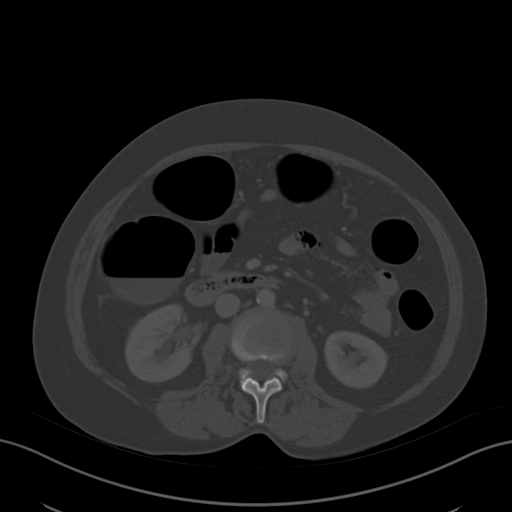
[im 61/86  soft-tissue]
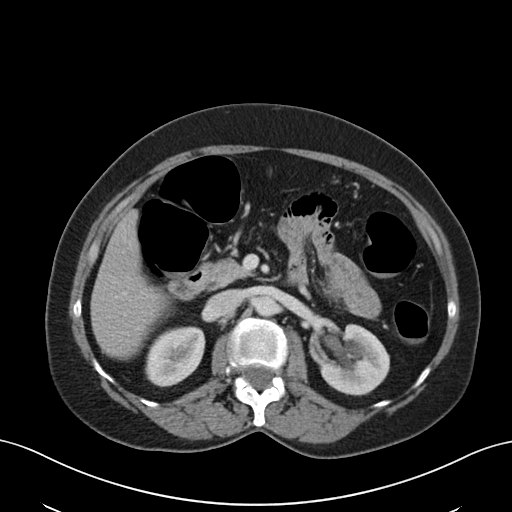
[im 67/86  soft-tissue]
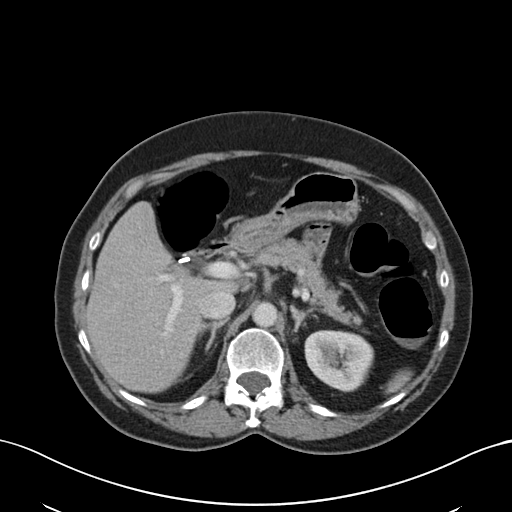
[im 73/86  soft-tissue]
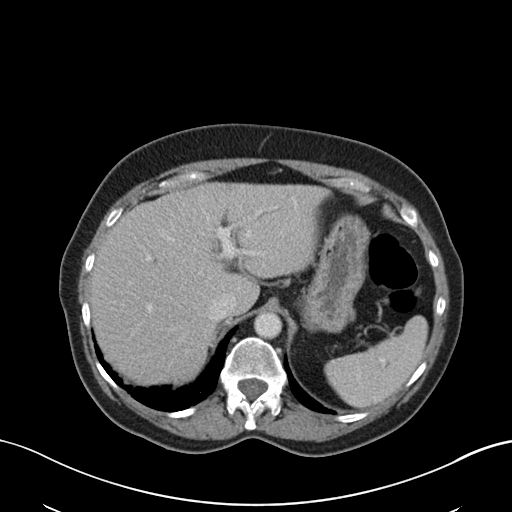
[im 79/86  soft-tissue]
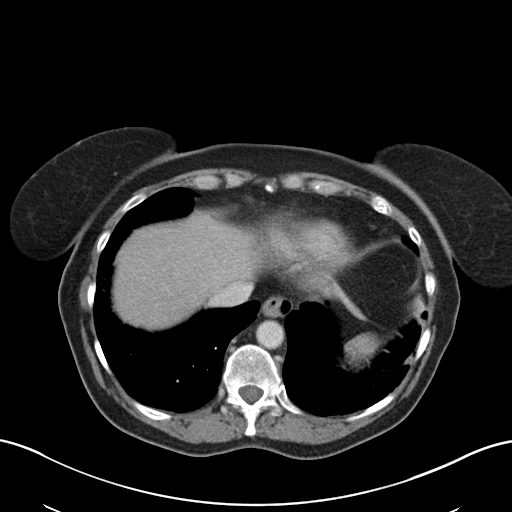

[Series 5: coronal st · coronal · 0.73mm/px · 3 of 87 slices shown]
[im 29/87  soft-tissue]
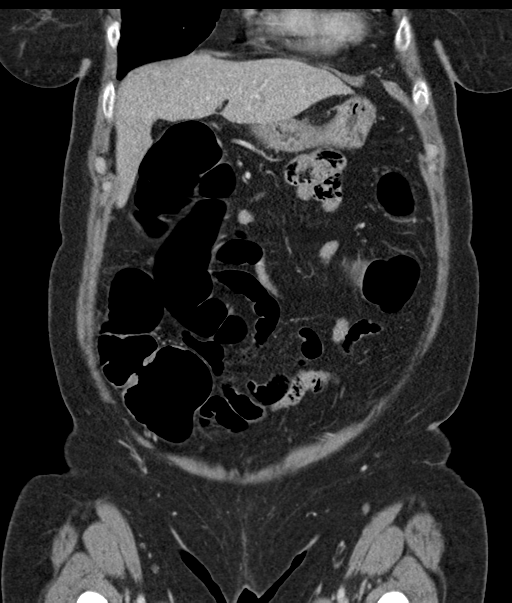
[im 39/87  soft-tissue]
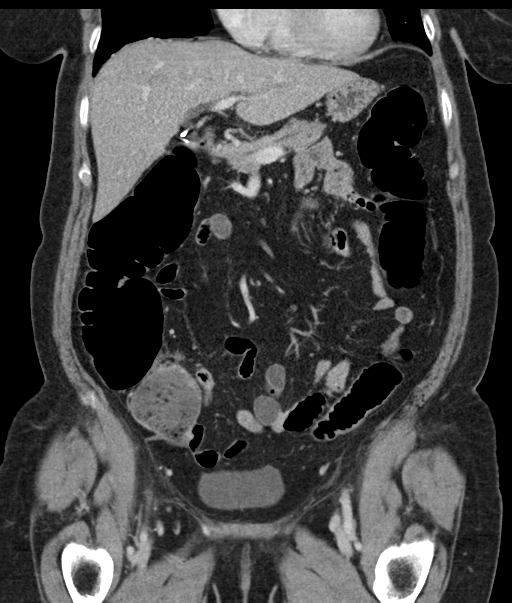
[im 48/87  soft-tissue]
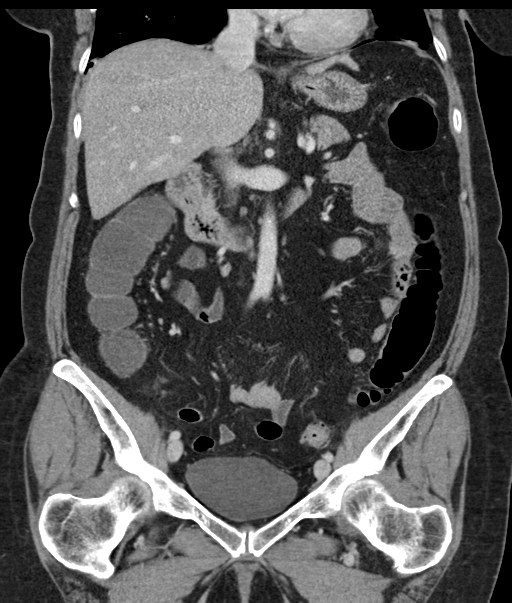

[16 of 46 positions shown; findings below may reference images not displayed]

FINDINGS: Lower chest: Lung bases demonstrate some minimal atelectatic change
although no focal infiltrate or effusion is seen.

Hepatobiliary: No focal liver abnormality is seen. Status post
cholecystectomy. No biliary dilatation.

Pancreas: Unremarkable. No pancreatic ductal dilatation or
surrounding inflammatory changes.

Spleen: Normal in size without focal abnormality.

Adrenals/Urinary Tract: Adrenal glands are within normal limits.
Kidneys are well visualized bilaterally. No obstructive changes are
seen. No renal calculi are noted. Peripelvic cysts are noted on the
left. The bladder is well distended.

Stomach/Bowel: The appendix is not visualized consistent with prior
surgical history. Diffuse diverticular changes noted in the sigmoid
colon with smooth muscle hypertrophy and wall thickening similar to
that seen on recent colonoscopy. There are no findings to suggest
acute perforation.

Vascular/Lymphatic: Aortic atherosclerosis. No enlarged abdominal or
pelvic lymph nodes.

Reproductive: Status post hysterectomy. No adnexal masses.

Other: No abdominal wall hernia or abnormality. No abdominopelvic
ascites.

Musculoskeletal: Degenerative changes of lumbar spine are noted
without acute abnormality.
IMPRESSION: No evidence of perforation following colonoscopy. There is some
retained air within the proximal colon identified.

Changes consistent with diverticulosis with wall thickening and
narrowing similar to that seen on recent colonoscopy.

No acute abnormality is identified.

## 2022-05-05 ENCOUNTER — Other Ambulatory Visit: Payer: Self-pay | Admitting: Neurosurgery

## 2022-05-07 ENCOUNTER — Other Ambulatory Visit: Payer: Self-pay | Admitting: Neurosurgery

## 2022-05-16 HISTORY — PX: BACK SURGERY: SHX140

## 2022-05-19 NOTE — Pre-Procedure Instructions (Signed)
Surgical Instructions    Your procedure is scheduled on May 31, 2022.  Report to [facility] Main Entrance "A" at 9:50 A.M., then check in with the Admitting office.  Call this number if you have problems the morning of surgery:  [phone]   If you have any questions prior to your surgery date call [phone]: Open Monday-Friday 8am-4pm    Remember:  Do not eat or drink after midnight the night before your surgery      Take these medicines the morning of surgery with A SIP OF WATER:  omeprazole (PRILOSEC)     Take these medicines the morning of surgery with a sip of water AS NEEDED:  cyclobenzaprine (FLEXERIL)   hyoscyamine (LEVSIN, ANASPAZ)   LORazepam (ATIVAN)   Polyethyl Glycol-Propyl Glycol (SYSTANE OP)  traMADol (ULTRAM)    As of today, STOP taking any Aspirin (unless otherwise instructed by your surgeon) Aleve, Naproxen, Ibuprofen, Motrin, Advil, Goody's, BC's, all herbal medications, fish oil, and all vitamins.                     Do NOT Smoke (Tobacco/Vaping) for 24 hours prior to your procedure.  If you use a CPAP at night, you may bring your mask/headgear for your overnight stay.   Contacts, glasses, piercing's, hearing aid's, dentures or partials may not be worn into surgery, please bring cases for these belongings.    For patients admitted to the hospital, discharge time will be determined by your treatment team.   Patients discharged the day of surgery will not be allowed to drive home, and someone needs to stay with them for 24 hours.  SURGICAL WAITING ROOM VISITATION Patients having surgery or a procedure may have no more than 2 support people in the waiting area - these visitors may rotate.   Children under the age of 93 must have an adult with them who is not the patient. If the patient needs to stay at the hospital during part of their recovery, the visitor guidelines for inpatient rooms apply. Pre-op nurse will coordinate an appropriate time for  1 support person to accompany patient in pre-op.  This support person may not rotate.   Please refer to the [facility] website for the visitor guidelines for Inpatients (after your surgery is over and you are in a regular room).    Special instructions:   [place]- Preparing For Surgery  Before surgery, you can play an important role. Because skin is not sterile, your skin needs to be as free of germs as possible. You can reduce the number of germs on your skin by washing with CHG (chlorahexidine gluconate) Soap before surgery.  CHG is an antiseptic cleaner which kills germs and bonds with the skin to continue killing germs even after washing.    Oral Hygiene is also important to reduce your risk of infection.  Remember - BRUSH YOUR TEETH THE MORNING OF SURGERY WITH YOUR REGULAR TOOTHPASTE  Please do not use if you have an allergy to CHG or antibacterial soaps. If your skin becomes reddened/irritated stop using the CHG.  Do not shave (including legs and underarms) for at least 48 hours prior to first CHG shower. It is OK to shave your face.  Please follow these instructions carefully.   Shower the NIGHT BEFORE SURGERY and the MORNING OF SURGERY  If you chose to wash your hair, wash your hair first as usual with your normal shampoo.  After you shampoo, rinse your hair and body  thoroughly to remove the shampoo.  Use CHG Soap as you would any other liquid soap. You can apply CHG directly to the skin and wash gently with a scrungie or a clean washcloth.   Apply the CHG Soap to your body ONLY FROM THE NECK DOWN.  Do not use on open wounds or open sores. Avoid contact with your eyes, ears, mouth and genitals (private parts). Wash Face and genitals (private parts)  with your normal soap.   Wash thoroughly, paying special attention to the area where your surgery will be performed.  Thoroughly rinse your body with warm water from the neck down.  DO NOT shower/wash with your normal soap after  using and rinsing off the CHG Soap.  Pat yourself dry with a CLEAN TOWEL.  Wear CLEAN PAJAMAS to bed the night before surgery  Place CLEAN SHEETS on your bed the night before your surgery  DO NOT SLEEP WITH PETS.   Day of Surgery: Take a shower with CHG soap. Do not wear jewelry or makeup Do not wear lotions, powders, perfumes/colognes, or deodorant. Do not shave 48 hours prior to surgery.  Men may shave face and neck. Do not bring valuables to the hospital.  [facility] is not responsible for any belongings or valuables. Do not wear nail polish, gel polish, artificial nails, or any other type of covering on natural nails (fingers and toes) If you have artificial nails or gel coating that need to be removed by a nail salon, please have this removed prior to surgery. Artificial nails or gel coating may interfere with anesthesia's ability to adequately monitor your vital signs. Wear Clean/Comfortable clothing the morning of surgery Remember to brush your teeth WITH YOUR REGULAR TOOTHPASTE.   Please read over the following fact sheets that you were given.    If you received a COVID test during your pre-op visit  it is requested that you wear a mask when out in public, stay away from anyone that may not be feeling well and notify your surgeon if you develop symptoms. If you have been in contact with anyone that has tested positive in the last 10 days please notify you surgeon.

## 2022-05-20 ENCOUNTER — Inpatient Hospital Stay (HOSPITAL_COMMUNITY)
Admission: RE | Admit: 2022-05-20 | Discharge: 2022-05-20 | Disposition: A | Discharge status: Home / Self Care | Payer: Medicare Other | Source: Ambulatory Visit

## 2022-05-25 NOTE — Progress Notes (Signed)
Patient tested positive on home covid test on 05/19/22.  Patient notified scheduler and PAT appointment canceled.  Patient states that she is currently symptomatic with a fever and cough.  Nikki at Dr. Arnoldo Morale' office notified and states she will follow-up with patient.

## 2022-05-26 ENCOUNTER — Inpatient Hospital Stay (HOSPITAL_COMMUNITY): Admission: RE | Admit: 2022-05-26 | Payer: Medicare Other | Source: Ambulatory Visit

## 2022-05-27 ENCOUNTER — Encounter (HOSPITAL_COMMUNITY): Payer: Self-pay | Admitting: Neurosurgery

## 2022-05-27 NOTE — Progress Notes (Signed)
Received phone call from [facility] at Dr. Arnoldo Morale' office. Patient says she is recovered from COVID-19. Dr. Arnoldo Morale is planning to keep case as scheduled for 05/31/22 which will be > 10 days out for when she tested positive on 05/19/22. She will now be a same day work-up.    Myra Gianotti, PA-C Surgical Short Stay/Anesthesiology [facility] Phone [phone] 05/27/2022 4:00 PM

## 2022-05-28 ENCOUNTER — Encounter (HOSPITAL_COMMUNITY): Payer: Self-pay | Admitting: Neurosurgery

## 2022-05-28 ENCOUNTER — Other Ambulatory Visit: Payer: Self-pay

## 2022-05-28 DIAGNOSIS — M5116 Intervertebral disc disorders with radiculopathy, lumbar region: Secondary | ICD-10-CM | POA: Diagnosis not present

## 2022-05-28 DIAGNOSIS — M4316 Spondylolisthesis, lumbar region: Secondary | ICD-10-CM | POA: Diagnosis present

## 2022-05-28 DIAGNOSIS — I341 Nonrheumatic mitral (valve) prolapse: Secondary | ICD-10-CM | POA: Diagnosis not present

## 2022-05-28 DIAGNOSIS — F419 Anxiety disorder, unspecified: Secondary | ICD-10-CM | POA: Diagnosis not present

## 2022-05-28 DIAGNOSIS — Z8616 Personal history of COVID-19: Secondary | ICD-10-CM | POA: Diagnosis not present

## 2022-05-28 DIAGNOSIS — K219 Gastro-esophageal reflux disease without esophagitis: Secondary | ICD-10-CM | POA: Diagnosis not present

## 2022-05-28 DIAGNOSIS — M1990 Unspecified osteoarthritis, unspecified site: Secondary | ICD-10-CM | POA: Diagnosis not present

## 2022-05-28 DIAGNOSIS — M48062 Spinal stenosis, lumbar region with neurogenic claudication: Secondary | ICD-10-CM | POA: Diagnosis not present

## 2022-05-28 NOTE — Progress Notes (Signed)
PCP - Dr Jenetta Downer [place] Cardiologist - n/a Gertie Fey - Dr Silvano Rusk  Chest x-ray - n/a EKG - n/a Stress Test - n/a ECHO - n/a Cardiac Cath - n/a  ICD Pacemaker/Loop - n/a  Sleep Study -  n/a CPAP - none  STOP now taking any Aspirin (unless otherwise instructed by your surgeon), Aleve, Naproxen, Ibuprofen, Motrin, Advil, Goody's, BC's, all herbal medications, fish oil, and all vitamins.   Anesthesia review: Yes.    Coronavirus Screening Positive Covid on 05/19/22.  Patient states she is recovered from Covid.  Ebony Hail, PA is aware of patient's hx of Covid.  See note in Epic. Do you have any of the following symptoms:  Cough yes/no: No Fever (>100.43F)  yes/no: No Runny nose yes/no: No Sore throat yes/no: No Difficulty breathing/shortness of breath  yes/no: No  Have you traveled in the last 14 days and where? yes/no: No  Patient verbalized understanding of instructions that were given via phone.

## 2022-05-31 ENCOUNTER — Ambulatory Visit (HOSPITAL_COMMUNITY): Payer: Medicare Other

## 2022-05-31 ENCOUNTER — Ambulatory Visit (HOSPITAL_COMMUNITY): Payer: Medicare Other | Admitting: Vascular Surgery

## 2022-05-31 ENCOUNTER — Ambulatory Visit (HOSPITAL_COMMUNITY)
Admission: RE | Admit: 2022-05-31 | Discharge: 2022-06-01 | Disposition: A | Discharge status: Home / Self Care | Payer: Medicare Other | Attending: Neurosurgery | Admitting: Neurosurgery

## 2022-05-31 ENCOUNTER — Ambulatory Visit (HOSPITAL_COMMUNITY)
Admission: RE | Disposition: A | Discharge status: Home / Self Care | Payer: Self-pay | Source: Home / Self Care | Attending: Neurosurgery

## 2022-05-31 ENCOUNTER — Ambulatory Visit (HOSPITAL_BASED_OUTPATIENT_CLINIC_OR_DEPARTMENT_OTHER): Payer: Medicare Other | Admitting: Vascular Surgery

## 2022-05-31 ENCOUNTER — Encounter (HOSPITAL_COMMUNITY): Payer: Self-pay | Admitting: Neurosurgery

## 2022-05-31 ENCOUNTER — Other Ambulatory Visit: Payer: Self-pay

## 2022-05-31 DIAGNOSIS — M48062 Spinal stenosis, lumbar region with neurogenic claudication: Secondary | ICD-10-CM | POA: Diagnosis not present

## 2022-05-31 DIAGNOSIS — M1990 Unspecified osteoarthritis, unspecified site: Secondary | ICD-10-CM | POA: Insufficient documentation

## 2022-05-31 DIAGNOSIS — M4316 Spondylolisthesis, lumbar region: Secondary | ICD-10-CM | POA: Diagnosis not present

## 2022-05-31 DIAGNOSIS — Z8616 Personal history of COVID-19: Secondary | ICD-10-CM | POA: Insufficient documentation

## 2022-05-31 DIAGNOSIS — K219 Gastro-esophageal reflux disease without esophagitis: Secondary | ICD-10-CM | POA: Insufficient documentation

## 2022-05-31 DIAGNOSIS — M5116 Intervertebral disc disorders with radiculopathy, lumbar region: Secondary | ICD-10-CM | POA: Insufficient documentation

## 2022-05-31 DIAGNOSIS — F419 Anxiety disorder, unspecified: Secondary | ICD-10-CM | POA: Insufficient documentation

## 2022-05-31 DIAGNOSIS — I341 Nonrheumatic mitral (valve) prolapse: Secondary | ICD-10-CM | POA: Insufficient documentation

## 2022-05-31 HISTORY — DX: Gastro-esophageal reflux disease without esophagitis: K21.9

## 2022-05-31 LAB — BASIC METABOLIC PANEL
Anion gap: 9 (ref 5–15)
BUN: 5 mg/dL — ABNORMAL LOW (ref 8–23)
CO2: 21 mmol/L — ABNORMAL LOW (ref 22–32)
Calcium: 8.4 mg/dL — ABNORMAL LOW (ref 8.9–10.3)
Chloride: 106 mmol/L (ref 98–111)
Creatinine, Ser: 0.76 mg/dL (ref 0.44–1.00)
GFR, Estimated: >60 mL/min (ref >60)
Glucose, Bld: 106 mg/dL — ABNORMAL HIGH (ref 70–99)
Potassium: 3.3 mmol/L — ABNORMAL LOW (ref 3.5–5.1)
Sodium: 136 mmol/L (ref 135–145)

## 2022-05-31 LAB — CBC
HCT: 38 % (ref 36.0–46.0)
Hemoglobin: 12.5 g/dL (ref 12.0–15.0)
MCH: 28.9 pg (ref 26.0–34.0)
MCHC: 32.9 g/dL (ref 30.0–36.0)
MCV: 87.8 fL (ref 80.0–100.0)
Platelets: 346 10*3/uL (ref 150–400)
RBC: 4.33 MIL/uL (ref 3.87–5.11)
RDW: 12.3 % (ref 11.5–15.5)
WBC: 9.4 10*3/uL (ref 4.0–10.5)
nRBC: 0 % (ref 0.0–0.2)

## 2022-05-31 LAB — TYPE AND SCREEN
ABO/RH(D): A POS
Antibody Screen: NEGATIVE

## 2022-05-31 SURGERY — POSTERIOR LUMBAR FUSION 1 LEVEL
Anesthesia: General

## 2022-05-31 MED ORDER — OXYCODONE HCL 5 MG PO TABS: 5.0000 mg | ORAL_TABLET | ORAL | Status: DC | PRN

## 2022-05-31 MED ORDER — FENTANYL CITRATE (PF) 250 MCG/5ML IJ SOLN
INTRAMUSCULAR | Status: AC
  Filled 2022-05-31: qty 5

## 2022-05-31 MED ORDER — ACETAMINOPHEN 500 MG PO TABS
1000.0000 mg | ORAL_TABLET | Freq: Once | ORAL | Status: AC
  Administered 2022-05-31: 1000 mg via ORAL
  Filled 2022-05-31: qty 2

## 2022-05-31 MED ORDER — FENTANYL CITRATE (PF) 100 MCG/2ML IJ SOLN
INTRAMUSCULAR | Status: AC
  Filled 2022-05-31: qty 2

## 2022-05-31 MED ORDER — HYOSCYAMINE SULFATE 0.125 MG PO TABS: 0.1250 mg | ORAL_TABLET | Freq: Two times a day (BID) | ORAL | Status: DC | PRN

## 2022-05-31 MED ORDER — THROMBIN 5000 UNITS EX SOLR
CUTANEOUS | Status: AC
  Filled 2022-05-31: qty 5000

## 2022-05-31 MED ORDER — ONDANSETRON HCL 4 MG PO TABS: 4.0000 mg | ORAL_TABLET | Freq: Four times a day (QID) | ORAL | Status: DC | PRN

## 2022-05-31 MED ORDER — ACETAMINOPHEN 500 MG PO TABS
1000.0000 mg | ORAL_TABLET | Freq: Four times a day (QID) | ORAL | Status: DC
  Administered 2022-05-31 – 2022-06-01 (×3): 1000 mg via ORAL
  Filled 2022-05-31 (×4): qty 2

## 2022-05-31 MED ORDER — ZOLPIDEM TARTRATE 5 MG PO TABS
5.0000 mg | ORAL_TABLET | Freq: Every evening | ORAL | Status: DC | PRN
  Administered 2022-05-31: 5 mg via ORAL
  Filled 2022-05-31: qty 1

## 2022-05-31 MED ORDER — DEXAMETHASONE SODIUM PHOSPHATE 10 MG/ML IJ SOLN
INTRAMUSCULAR | Status: AC
  Filled 2022-05-31: qty 1

## 2022-05-31 MED ORDER — FENTANYL CITRATE (PF) 100 MCG/2ML IJ SOLN
25.0000 ug | INTRAMUSCULAR | Status: DC | PRN
  Administered 2022-05-31 (×3): 25 ug via INTRAVENOUS

## 2022-05-31 MED ORDER — PHENYLEPHRINE 80 MCG/ML (10ML) SYRINGE FOR IV PUSH (FOR BLOOD PRESSURE SUPPORT)
PREFILLED_SYRINGE | INTRAVENOUS | Status: DC | PRN
  Administered 2022-05-31: 40 ug via INTRAVENOUS

## 2022-05-31 MED ORDER — CHLORHEXIDINE GLUCONATE 0.12 % MT SOLN
15.0000 mL | OROMUCOSAL | Status: AC
  Administered 2022-05-31: 15 mL via OROMUCOSAL
  Filled 2022-05-31: qty 15

## 2022-05-31 MED ORDER — CEFAZOLIN SODIUM-DEXTROSE 2-4 GM/100ML-% IV SOLN
2.0000 g | Freq: Three times a day (TID) | INTRAVENOUS | Status: AC
  Administered 2022-05-31 – 2022-06-01 (×2): 2 g via INTRAVENOUS
  Filled 2022-05-31 (×2): qty 100

## 2022-05-31 MED ORDER — ONDANSETRON HCL 4 MG/2ML IJ SOLN
INTRAMUSCULAR | Status: AC
  Filled 2022-05-31: qty 2

## 2022-05-31 MED ORDER — SODIUM CHLORIDE 0.9 % IV SOLN: 250.0000 mL | INTRAVENOUS | Status: DC

## 2022-05-31 MED ORDER — SODIUM CHLORIDE 0.9% FLUSH: 3.0000 mL | INTRAVENOUS | Status: DC | PRN

## 2022-05-31 MED ORDER — CHLORHEXIDINE GLUCONATE CLOTH 2 % EX PADS: 6.0000 | MEDICATED_PAD | Freq: Once | CUTANEOUS | Status: DC

## 2022-05-31 MED ORDER — BISACODYL 10 MG RE SUPP: 10.0000 mg | Freq: Every day | RECTAL | Status: DC | PRN

## 2022-05-31 MED ORDER — LIDOCAINE 2% (20 MG/ML) 5 ML SYRINGE
INTRAMUSCULAR | Status: DC | PRN
  Administered 2022-05-31: 80 mg via INTRAVENOUS

## 2022-05-31 MED ORDER — THROMBIN 5000 UNITS EX SOLR
OROMUCOSAL | Status: DC | PRN
  Administered 2022-05-31 (×2): 5 mL via TOPICAL

## 2022-05-31 MED ORDER — SUGAMMADEX SODIUM 200 MG/2ML IV SOLN
INTRAVENOUS | Status: DC | PRN
  Administered 2022-05-31: 400 mg via INTRAVENOUS

## 2022-05-31 MED ORDER — OXYCODONE HCL 5 MG PO TABS
10.0000 mg | ORAL_TABLET | ORAL | Status: DC | PRN
  Administered 2022-05-31 – 2022-06-01 (×3): 10 mg via ORAL
  Filled 2022-05-31 (×3): qty 2

## 2022-05-31 MED ORDER — BUPIVACAINE-EPINEPHRINE (PF) 0.5% -1:200000 IJ SOLN
INTRAMUSCULAR | Status: DC | PRN
  Administered 2022-05-31: 10 mL via PERINEURAL

## 2022-05-31 MED ORDER — FENTANYL CITRATE (PF) 250 MCG/5ML IJ SOLN
INTRAMUSCULAR | Status: DC | PRN
  Administered 2022-05-31 (×5): 50 ug via INTRAVENOUS

## 2022-05-31 MED ORDER — HYDROMORPHONE HCL 1 MG/ML IJ SOLN: 0.5000 mg | INTRAMUSCULAR | Status: DC | PRN

## 2022-05-31 MED ORDER — PANTOPRAZOLE SODIUM 40 MG PO TBEC: 40.0000 mg | DELAYED_RELEASE_TABLET | Freq: Every day | ORAL | Status: DC

## 2022-05-31 MED ORDER — PANTOPRAZOLE SODIUM 40 MG PO TBEC
40.0000 mg | DELAYED_RELEASE_TABLET | Freq: Two times a day (BID) | ORAL | Status: DC
  Administered 2022-06-01: 40 mg via ORAL
  Filled 2022-05-31: qty 1

## 2022-05-31 MED ORDER — BUPIVACAINE-EPINEPHRINE (PF) 0.5% -1:200000 IJ SOLN
INTRAMUSCULAR | Status: AC
  Filled 2022-05-31: qty 30

## 2022-05-31 MED ORDER — LIDOCAINE 2% (20 MG/ML) 5 ML SYRINGE
INTRAMUSCULAR | Status: AC
  Filled 2022-05-31: qty 5

## 2022-05-31 MED ORDER — DEXAMETHASONE SODIUM PHOSPHATE 10 MG/ML IJ SOLN
INTRAMUSCULAR | Status: DC | PRN
  Administered 2022-05-31: 10 mg via INTRAVENOUS

## 2022-05-31 MED ORDER — BUPIVACAINE LIPOSOME 1.3 % IJ SUSP
INTRAMUSCULAR | Status: AC
  Filled 2022-05-31: qty 20

## 2022-05-31 MED ORDER — ACETAMINOPHEN 325 MG PO TABS: 650.0000 mg | ORAL_TABLET | ORAL | Status: DC | PRN

## 2022-05-31 MED ORDER — CYCLOBENZAPRINE HCL 5 MG PO TABS: 5.0000 mg | ORAL_TABLET | Freq: Every day | ORAL | Status: DC | PRN

## 2022-05-31 MED ORDER — ACETAMINOPHEN 10 MG/ML IV SOLN
INTRAVENOUS | Status: DC | PRN
  Administered 2022-05-31: 1000 mg via INTRAVENOUS

## 2022-05-31 MED ORDER — ACETAMINOPHEN 650 MG RE SUPP: 650.0000 mg | RECTAL | Status: DC | PRN

## 2022-05-31 MED ORDER — ROCURONIUM BROMIDE 10 MG/ML (PF) SYRINGE
PREFILLED_SYRINGE | INTRAVENOUS | Status: AC
  Filled 2022-05-31: qty 10

## 2022-05-31 MED ORDER — MIDAZOLAM HCL 2 MG/2ML IJ SOLN
INTRAMUSCULAR | Status: AC
  Filled 2022-05-31: qty 2

## 2022-05-31 MED ORDER — LACTATED RINGERS IV SOLN: INTRAVENOUS | Status: DC

## 2022-05-31 MED ORDER — 0.9 % SODIUM CHLORIDE (POUR BTL) OPTIME
TOPICAL | Status: DC | PRN
  Administered 2022-05-31: 1000 mL

## 2022-05-31 MED ORDER — LORAZEPAM 0.5 MG PO TABS: 0.5000 mg | ORAL_TABLET | Freq: Three times a day (TID) | ORAL | Status: DC | PRN

## 2022-05-31 MED ORDER — MENTHOL 3 MG MT LOZG: 1.0000 | LOZENGE | OROMUCOSAL | Status: DC | PRN

## 2022-05-31 MED ORDER — ONDANSETRON HCL 4 MG/2ML IJ SOLN: 4.0000 mg | Freq: Once | INTRAMUSCULAR | Status: DC | PRN

## 2022-05-31 MED ORDER — LORATADINE 10 MG PO TABS
10.0000 mg | ORAL_TABLET | Freq: Every day | ORAL | Status: DC
  Administered 2022-05-31 – 2022-06-01 (×2): 10 mg via ORAL
  Filled 2022-05-31 (×2): qty 1

## 2022-05-31 MED ORDER — CYCLOBENZAPRINE HCL 10 MG PO TABS: 10.0000 mg | ORAL_TABLET | Freq: Three times a day (TID) | ORAL | Status: DC | PRN

## 2022-05-31 MED ORDER — ONDANSETRON HCL 4 MG/2ML IJ SOLN: 4.0000 mg | Freq: Four times a day (QID) | INTRAMUSCULAR | Status: DC | PRN

## 2022-05-31 MED ORDER — PHENYLEPHRINE 80 MCG/ML (10ML) SYRINGE FOR IV PUSH (FOR BLOOD PRESSURE SUPPORT)
PREFILLED_SYRINGE | INTRAVENOUS | Status: AC
  Filled 2022-05-31: qty 10

## 2022-05-31 MED ORDER — ESMOLOL HCL 100 MG/10ML IV SOLN
INTRAVENOUS | Status: DC | PRN
  Administered 2022-05-31: 30 ug via INTRAVENOUS

## 2022-05-31 MED ORDER — ROCURONIUM BROMIDE 10 MG/ML (PF) SYRINGE
PREFILLED_SYRINGE | INTRAVENOUS | Status: DC | PRN
  Administered 2022-05-31: 20 mg via INTRAVENOUS
  Administered 2022-05-31: 30 mg via INTRAVENOUS
  Administered 2022-05-31: 50 mg via INTRAVENOUS

## 2022-05-31 MED ORDER — CEFAZOLIN SODIUM-DEXTROSE 2-4 GM/100ML-% IV SOLN
2.0000 g | INTRAVENOUS | Status: AC
  Administered 2022-05-31: 2 g via INTRAVENOUS
  Filled 2022-05-31: qty 100

## 2022-05-31 MED ORDER — SODIUM CHLORIDE 0.9% FLUSH
3.0000 mL | Freq: Two times a day (BID) | INTRAVENOUS | Status: DC
  Administered 2022-05-31: 3 mL via INTRAVENOUS

## 2022-05-31 MED ORDER — BACITRACIN ZINC 500 UNIT/GM EX OINT
TOPICAL_OINTMENT | CUTANEOUS | Status: DC | PRN
  Administered 2022-05-31: 1 via TOPICAL

## 2022-05-31 MED ORDER — ONDANSETRON HCL 4 MG/2ML IJ SOLN
INTRAMUSCULAR | Status: DC | PRN
  Administered 2022-05-31: 4 mg via INTRAVENOUS

## 2022-05-31 MED ORDER — MIDAZOLAM HCL 5 MG/5ML IJ SOLN
INTRAMUSCULAR | Status: DC | PRN
  Administered 2022-05-31: 1 mg via INTRAVENOUS

## 2022-05-31 MED ORDER — PROPOFOL 10 MG/ML IV BOLUS
INTRAVENOUS | Status: DC | PRN
  Administered 2022-05-31: 40 mg via INTRAVENOUS
  Administered 2022-05-31: 120 mg via INTRAVENOUS

## 2022-05-31 MED ORDER — TRAMADOL HCL 50 MG PO TABS
50.0000 mg | ORAL_TABLET | Freq: Four times a day (QID) | ORAL | Status: DC | PRN
  Administered 2022-05-31: 50 mg via ORAL

## 2022-05-31 MED ORDER — DOCUSATE SODIUM 100 MG PO CAPS
100.0000 mg | ORAL_CAPSULE | Freq: Two times a day (BID) | ORAL | Status: DC
  Administered 2022-05-31 – 2022-06-01 (×2): 100 mg via ORAL
  Filled 2022-05-31 (×2): qty 1

## 2022-05-31 MED ORDER — BACITRACIN ZINC 500 UNIT/GM EX OINT
TOPICAL_OINTMENT | CUTANEOUS | Status: AC
  Filled 2022-05-31: qty 28.35

## 2022-05-31 MED ORDER — PHENYLEPHRINE HCL-NACL 20-0.9 MG/250ML-% IV SOLN
INTRAVENOUS | Status: DC | PRN
  Administered 2022-05-31: 10 ug/min via INTRAVENOUS

## 2022-05-31 MED ORDER — PHENOL 1.4 % MT LIQD: 1.0000 | OROMUCOSAL | Status: DC | PRN

## 2022-05-31 MED ORDER — BUPIVACAINE LIPOSOME 1.3 % IJ SUSP
INTRAMUSCULAR | Status: DC | PRN
  Administered 2022-05-31: 20 mL

## 2022-05-31 MED ORDER — TRAMADOL HCL 50 MG PO TABS
ORAL_TABLET | ORAL | Status: AC
  Filled 2022-05-31: qty 1

## 2022-05-31 SURGICAL SUPPLY — 66 items
BAG COUNTER SPONGE SURGICOUNT (BAG) ×1 IMPLANT
BASKET BONE COLLECTION (BASKET) ×1 IMPLANT
BENZOIN TINCTURE PRP APPL 2/3 (GAUZE/BANDAGES/DRESSINGS) ×1 IMPLANT
BLADE CLIPPER SURG (BLADE) IMPLANT
BUR MATCHSTICK NEURO 3.0 LAGG (BURR) ×1 IMPLANT
BUR PRECISION FLUTE 6.0 (BURR) ×1 IMPLANT
CAGE ALTERA 10X31X10-14 15D (Cage) IMPLANT
CANISTER SUCT 3000ML PPV (MISCELLANEOUS) ×1 IMPLANT
CAP LOCK DLX THRD (Cap) IMPLANT
CNTNR URN SCR LID CUP LEK RST (MISCELLANEOUS) ×1 IMPLANT
CONT SPEC 4OZ STRL OR WHT (MISCELLANEOUS) ×1
COVER BACK TABLE 60X90IN (DRAPES) ×1 IMPLANT
DRAPE C-ARM 42X72 X-RAY (DRAPES) ×2 IMPLANT
DRAPE HALF SHEET 40X57 (DRAPES) ×1 IMPLANT
DRAPE LAPAROTOMY 100X72X124 (DRAPES) ×1 IMPLANT
DRAPE SURG 17X23 STRL (DRAPES) ×4 IMPLANT
DRSG OPSITE POSTOP 4X6 (GAUZE/BANDAGES/DRESSINGS) ×1 IMPLANT
ELECT BLADE 4.0 EZ CLEAN MEGAD (MISCELLANEOUS) ×1
ELECT REM PT RETURN 9FT ADLT (ELECTROSURGICAL) ×1
ELECTRODE BLDE 4.0 EZ CLN MEGD (MISCELLANEOUS) ×1 IMPLANT
ELECTRODE REM PT RTRN 9FT ADLT (ELECTROSURGICAL) ×1 IMPLANT
EVACUATOR 1/8 PVC DRAIN (DRAIN) IMPLANT
GAUZE 4X4 16PLY ~~LOC~~+RFID DBL (SPONGE) ×1 IMPLANT
GLOVE BIO SURGEON STRL SZ 6 (GLOVE) ×1 IMPLANT
GLOVE BIO SURGEON STRL SZ8 (GLOVE) ×2 IMPLANT
GLOVE BIO SURGEON STRL SZ8.5 (GLOVE) ×2 IMPLANT
GLOVE BIOGEL PI IND STRL 6.5 (GLOVE) ×1 IMPLANT
GLOVE EXAM NITRILE XL STR (GLOVE) IMPLANT
GLOVE SS BIOGEL STRL SZ 6.5 (GLOVE) IMPLANT
GOWN STRL REUS W/ TWL LRG LVL3 (GOWN DISPOSABLE) ×1 IMPLANT
GOWN STRL REUS W/ TWL XL LVL3 (GOWN DISPOSABLE) ×2 IMPLANT
GOWN STRL REUS W/TWL 2XL LVL3 (GOWN DISPOSABLE) IMPLANT
GOWN STRL REUS W/TWL LRG LVL3 (GOWN DISPOSABLE) ×4
GOWN STRL REUS W/TWL XL LVL3 (GOWN DISPOSABLE) ×2
HEMOSTAT POWDER KIT SURGIFOAM (HEMOSTASIS) ×1 IMPLANT
KIT BASIN OR (CUSTOM PROCEDURE TRAY) ×1 IMPLANT
KIT GRAFTMAG DEL NEURO DISP (NEUROSURGERY SUPPLIES) IMPLANT
KIT TURNOVER KIT B (KITS) ×1 IMPLANT
NDL HYPO 21X1.5 SAFETY (NEEDLE) IMPLANT
NEEDLE HYPO 21X1.5 SAFETY (NEEDLE) ×1 IMPLANT
NEEDLE HYPO 22GX1.5 SAFETY (NEEDLE) ×1 IMPLANT
NS IRRIG 1000ML POUR BTL (IV SOLUTION) ×1 IMPLANT
PACK LAMINECTOMY NEURO (CUSTOM PROCEDURE TRAY) ×1 IMPLANT
PAD ARMBOARD 7.5X6 YLW CONV (MISCELLANEOUS) ×3 IMPLANT
PATTIES SURGICAL .5 X.5 (GAUZE/BANDAGES/DRESSINGS) IMPLANT
PATTIES SURGICAL .5 X1 (DISPOSABLE) IMPLANT
PATTIES SURGICAL 1X1 (DISPOSABLE) IMPLANT
PENCIL BUTTON HOLSTER BLD 10FT (ELECTRODE) IMPLANT
PUTTY DBM 10CC CALC GRAN (Putty) IMPLANT
ROD CURVED TI 6.35X45 (Rod) IMPLANT
SCREW PA DLX CREO 7.5X50 (Screw) IMPLANT
SPIKE FLUID TRANSFER (MISCELLANEOUS) ×1 IMPLANT
SPONGE NEURO XRAY DETECT 1X3 (DISPOSABLE) IMPLANT
SPONGE SURGIFOAM ABS GEL 100 (HEMOSTASIS) IMPLANT
SPONGE T-LAP 4X18 ~~LOC~~+RFID (SPONGE) IMPLANT
STRIP CLOSURE SKIN 1/2X4 (GAUZE/BANDAGES/DRESSINGS) ×1 IMPLANT
SUT VIC AB 1 CT1 18XBRD ANBCTR (SUTURE) ×2 IMPLANT
SUT VIC AB 1 CT1 8-18 (SUTURE) ×2
SUT VIC AB 2-0 CP2 18 (SUTURE) ×2 IMPLANT
SYR 20ML LL LF (SYRINGE) IMPLANT
TIP CART INSTAFILL GL 5CC (ORTHOPEDIC DISPOSABLE SUPPLIES) IMPLANT
TOWEL GREEN STERILE (TOWEL DISPOSABLE) ×1 IMPLANT
TOWEL GREEN STERILE FF (TOWEL DISPOSABLE) ×1 IMPLANT
TRAY FOLEY MTR SLVR 14FR STAT (SET/KITS/TRAYS/PACK) IMPLANT
TRAY FOLEY MTR SLVR 16FR STAT (SET/KITS/TRAYS/PACK) ×1 IMPLANT
WATER STERILE IRR 1000ML POUR (IV SOLUTION) ×1 IMPLANT

## 2022-05-31 NOTE — Progress Notes (Signed)
Attempted to call report, unable to take report at this time

## 2022-05-31 NOTE — Progress Notes (Signed)
Attempt to call report, unable at this time

## 2022-05-31 NOTE — Anesthesia Procedure Notes (Signed)
Procedure Name: Intubation Date/Time: 05/31/2022 1:42 PM  Performed by: Griffin Dakin, CRNAPre-anesthesia Checklist: Patient identified, Emergency Drugs available, Suction available and Patient being monitored Patient Re-evaluated:Patient Re-evaluated prior to induction Oxygen Delivery Method: Circle system utilized Preoxygenation: Pre-oxygenation with 100% oxygen Induction Type: IV induction Ventilation: Two handed mask ventilation required Laryngoscope Size: Mac and 3 Grade View: Grade I Tube type: Oral Number of attempts: 1 Airway Equipment and Method: Stylet and Oral airway Placement Confirmation: ETT inserted through vocal cords under direct vision, positive ETCO2 and breath sounds checked- equal and bilateral Secured at: 22 cm Tube secured with: Tape Dental Injury: Teeth and Oropharynx as per pre-operative assessment

## 2022-05-31 NOTE — Transfer of Care (Signed)
Immediate Anesthesia Transfer of Care Note  Patient: Rebekah Schmidt  Procedure(s) Performed: Posterior Lumbar Interbody Fusion ,Interbody Prosthesis, Posterior Instrumentation Lumbar Four-Five  Patient Location: PACU  Anesthesia Type:General  Level of Consciousness: awake, alert  and drowsy  Airway & Oxygen Therapy: Patient Spontanous Breathing and Patient connected to face mask oxygen  Post-op Assessment: Report given to RN and Post -op Vital signs reviewed and stable  Post vital signs: Reviewed and stable  Last Vitals:  Vitals Value Taken Time  BP 156/103 05/31/22 1716  Temp    Pulse 109 05/31/22 1722  Resp 15 05/31/22 1722  SpO2 95 % 05/31/22 1722  Vitals shown include unvalidated device data.  Last Pain:  Vitals:   05/31/22 0941  TempSrc:   PainSc: 0-No pain      Patients Stated Pain Goal: 0 (65/68/12 7517)  Complications: No notable events documented.

## 2022-05-31 NOTE — Anesthesia Postprocedure Evaluation (Signed)
Anesthesia Post Note  Patient: Rebekah Schmidt  Procedure(s) Performed: Posterior Lumbar Interbody Fusion ,Interbody Prosthesis, Posterior Instrumentation Lumbar Four-Five     Patient location during evaluation: PACU Anesthesia Type: General Level of consciousness: awake and alert Pain management: pain level controlled Vital Signs Assessment: post-procedure vital signs reviewed and stable Respiratory status: spontaneous breathing, nonlabored ventilation, respiratory function stable and patient connected to nasal cannula oxygen Cardiovascular status: blood pressure returned to baseline and stable Postop Assessment: no apparent nausea or vomiting Anesthetic complications: no   No notable events documented.  Last Vitals:  Vitals:   05/31/22 1800 05/31/22 1815  BP: (!) 147/76 (!) 146/64  Pulse: (!) 101 96  Resp: 14 15  Temp: (!) 36.1 C   SpO2: 95% 93%    Last Pain:  Vitals:   05/31/22 1800  TempSrc:   PainSc: [place] Eleanor Dimichele

## 2022-05-31 NOTE — H&P (Signed)
Subjective: The patient is a 71 year old white female who is complained of back and right leg pain consistent with lumbar radiculopathy/neurogenic claudication.  She has failed medical management and worked up with lumbar x-rays and lumbar MRI which demonstrated an L4-5 spondylolisthesis and spinal stenosis.  I discussed the various treatment options with her.  She has decided proceed with surgery.  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   Walking pneumonia    approx 25 years ago in [facility]    Past Surgical History:  Procedure Laterality Date   [place]     left ankle with 2 plates and screws   APPENDECTOMY     CHOLECYSTECTOMY     COLONOSCOPY     x 2 2007 was Negative, last one was on 05/22/18   ESOPHAGOGASTRODUODENOSCOPY  04/2011   Hiatal hernia with esophagitis no Barrett's, repeat 2018 - fundic gland polyps   PROCTOSCOPY N/A 09/06/2018   Procedure: RIGID PROCTOSCOPY;  Surgeon: Michael Boston, MD;  Location: [facility];  Service: General;  Laterality: N/A;   TUBAL LIGATION     UPPER GASTROINTESTINAL ENDOSCOPY  06/24/2017    Allergies  Allergen Reactions   Morphine And Related Itching    Social History   Tobacco Use   Smoking status: Never   Smokeless tobacco: Never  Substance Use Topics   Alcohol use: Not Currently    Comment: wine very occasional    Family History  Problem Relation Age of Onset   Stomach cancer Neg Hx    Colon cancer Neg Hx    Colon polyps Neg Hx    Esophageal  cancer Neg Hx    Rectal cancer Neg Hx    Prior to Admission medications   Medication Sig Start Date End Date Taking? Authorizing Provider  cyclobenzaprine (FLEXERIL) 5 MG tablet Take 5 mg by mouth daily as needed for muscle spasms.   Yes [provider]  fexofenadine (ALLEGRA) 180 MG tablet Take 180 mg by mouth every evening.   Yes [provider]  hyoscyamine (LEVSIN, ANASPAZ) 0.125 MG tablet Take 0.125 mg by mouth 2 (two) times daily as needed (abdominal spasms).   Yes [provider]  ibuprofen (ADVIL,MOTRIN) 200 MG tablet Take 200-400 mg by mouth every 8 (eight) hours as needed for pain (for pain.).   Yes [provider]  Ibuprofen-diphenhydrAMINE HCl (ADVIL PM) 200-25 MG CAPS Take 0.5 tablets by mouth at bedtime as needed (sleep).   Yes [provider]  LORazepam (ATIVAN) 0.5 MG tablet Take 0.5 mg by mouth 3 (three) times daily as needed for anxiety.  12/14/16  Yes [provider]  Multiple Vitamins-Minerals (PRESERVISION AREDS 2 PO) Take 1 tablet by mouth every evening.   Yes [provider]  omeprazole (PRILOSEC) 20 MG capsule Take 20 mg by mouth in the morning and at bedtime.   Yes [provider]  Polyethyl Glycol-Propyl Glycol (SYSTANE OP) Place 1 drop into both eyes 3 (three) times daily as needed (dry/irritated eyes.).   Yes [provider]  traMADol (ULTRAM) 50 MG tablet Take 1-2 tablets (50-100 mg total) by mouth every 6 (six) hours as needed for moderate pain or severe pain (mild pain). 09/08/18  Yes Michael Boston, MD  zolpidem (AMBIEN) 10 MG tablet Take 10 mg by mouth at bedtime.  11/06/16  Yes [provider]     Review of Systems  Positive ROS: As above  All other systems have been reviewed and were otherwise negative with the exception of those mentioned in the HPI and as above.  Objective: Vital signs in last 24 hours: Temp:  [98 F (36.7 C)] 98 F (36.7 C) (10/16 0936) Pulse Rate:   [118] 118 (10/16 0936) Resp:  [20] 20 (10/16 0936) BP: (157)/(81) 157/81 (10/16 0936) SpO2:  [98 %] 98 % (10/16 0936) Weight:  [72.6 kg] 72.6 kg (10/16 0936) Estimated body mass index is 26.63 kg/m as calculated from the following:   Height as of this encounter: '5\' 5"'$  (1.651 m).   Weight as of this encounter: 72.6 kg.   General Appearance: Alert Head: Normocephalic, without obvious abnormality, atraumatic Eyes: PERRL, conjunctiva/corneas clear, EOM's intact,    Ears: Normal  Throat: Normal  Neck: Supple, Back: unremarkable Lungs: Clear to auscultation bilaterally, respirations unlabored Heart: Regular rate and rhythm, no murmur, rub or gallop Abdomen: Soft, non-tender Extremities: Extremities normal, atraumatic, no cyanosis or edema Skin: unremarkable  NEUROLOGIC:   Mental status: alert and oriented,Motor Exam - grossly normal Sensory Exam - grossly normal Reflexes:  Coordination - grossly normal Gait - grossly normal Balance - grossly normal Cranial Nerves: I: smell Not tested  II: visual acuity  OS: Normal  OD: Normal   II: visual fields Full to confrontation  II: pupils Equal, round, reactive to light  III,VII: ptosis None  III,IV,VI: extraocular muscles  Full ROM  V: mastication Normal  V: facial light touch sensation  Normal  V,VII: corneal reflex  Present  VII: facial muscle function - upper  Normal  VII: facial muscle function - lower Normal  VIII: hearing Not tested  IX: soft palate elevation  Normal  IX,X: gag reflex Present  XI: trapezius strength  5/5  XI: sternocleidomastoid strength 5/5  XI: neck flexion strength  5/5  XII: tongue strength  Normal    Data Review Lab Results  Component Value Date   WBC 9.4 05/31/2022   HGB 12.5 05/31/2022   HCT 38.0 05/31/2022   MCV 87.8 05/31/2022   PLT 346 05/31/2022   Lab Results  Component Value Date   NA 136 05/31/2022   K 3.3 (L) 05/31/2022   CL 106 05/31/2022   CO2 21 (L) 05/31/2022   BUN 5 (L)  05/31/2022   CREATININE 0.76 05/31/2022   GLUCOSE 106 (H) 05/31/2022   No results found for: "INR", "PROTIME"  Assessment/Plan: Lumbar spondylolisthesis, lumbar facet arthropathy, lumbar spinal stenosis, lumbago, lumbar radiculopathy, neurogenic location: I have discussed the situation with the patient.  I have reviewed her imaging studies with her and pointed out the abnormalities.  We have discussed the various treatment options including surgery.  I have described the surgical treatment option L4-5 decompression, instrumentation and fusion.  I have shown her surgical models.  I have given her a surgical pamphlet.  We have discussed the risk, benefits, alternatives, expected postop course, and likelihood of achieving our goals with surgery.  I have answered all her questions.  She has decided proceed with surgery.   Ophelia Charter 05/31/2022 1:23 PM

## 2022-05-31 NOTE — Op Note (Signed)
Brief history: The patient is a 71 year old white female who is complained of back and leg pain consistent with neurogenic claudication/lumbar radiculopathy.  She has failed medical management.  She was worked up with a lumbar MRI and lumbar x-rays which demonstrated an L4-5 spondylolisthesis and spinal stenosis.  I discussed the various treatment options with her.  She has decided to proceed with surgery.  Preoperative diagnosis: L4-5 spondylolisthesis, degenerative disc disease, spinal stenosis compressing both the L4 and the L5 nerve roots; lumbago; lumbar radiculopathy; neurogenic claudication  Postoperative diagnosis: The same  Procedure: Bilateral L4-5 laminotomy/foraminotomies/medial facetectomy to decompress the bilateral L4 and L5 nerve roots(the work required to do this was in addition to the work required to do the posterior lumbar interbody fusion because of the patient's spinal stenosis, facet arthropathy. Etc. requiring a wide decompression of the nerve roots.);  Right L4-5 transforaminal lumbar interbody fusion with local morselized autograft bone and Zimmer DBM; insertion of interbody prosthesis at L4-5 (globus peek expandable interbody prosthesis); posterior nonsegmental instrumentation from L4 to L5 of with globus titanium pedicle screws and rods; posterior lateral arthrodesis at L4-5 with local morselized autograft bone and Zimmer DBM.  Surgeon: Dr. Earle Gell  Asst.: Dr. Deatra Ina and Arnetha Massy, NP  Anesthesia: Gen. endotracheal  Estimated blood loss: 200 cc  Drains: None  Complications: None  Description of procedure: The patient was brought to the operating room by the anesthesia team. General endotracheal anesthesia was induced. The patient was turned to the prone position on the Wilson frame. The patient's lumbosacral region was then prepared with Betadine scrub and Betadine solution. Sterile drapes were applied.  I then injected the area to be incised with  Marcaine with epinephrine solution. I then used the scalpel to make a linear midline incision over the L4-5 interspace. I then used electrocautery to perform a bilateral subperiosteal dissection exposing the spinous process and lamina of L4-5. We then obtained intraoperative radiograph to confirm our location. We then inserted the Verstrac retractor to provide exposure.  I began the decompression by using the high speed drill to perform laminotomies at L4-5 bilaterally. We then used the Kerrison punches to widen the laminotomy and removed the ligamentum flavum at L4-5 bilaterally. We used the Kerrison punches to remove the medial facets at L4-5 bilaterally, we removed the right L4-5 facet.. We performed wide foraminotomies about the bilateral L4 and L5 nerve roots completing the decompression.  We now turned our attention to the posterior lumbar interbody fusion. I used a scalpel to incise the intervertebral disc at L4-5 bilaterally. I then performed a partial intervertebral discectomy at L4-5 bilaterally using the pituitary forceps. We prepared the vertebral endplates at C1-2 bilaterally for the fusion by removing the soft tissues with the curettes. We then used the trial spacers to pick the appropriate sized interbody prosthesis. We prefilled his prosthesis with a combination of local morselized autograft bone that we obtained during the decompression as well as Zimmer DBM. We inserted the prefilled prosthesis into the interspace at L4-5 from the right, we then turned and expanded the prosthesis. There was a good snug fit of the prosthesis in the interspace. We then filled and the remainder of the intervertebral disc space with local morselized autograft bone and Zimmer DBM. This completed the posterior lumbar interbody arthrodesis.  During the decompression and insertion of the prosthesis the assistant protected the thecal sac and nerve roots with the D'Errico retractor.  We now turned attention to the  instrumentation. Under fluoroscopic guidance we  cannulated the bilateral L4 and L5 pedicles with the bone probe. We then removed the bone probe. We then tapped the pedicle with a 6.5 millimeter tap. We then removed the tap. We probed inside the tapped pedicle with a ball probe to rule out cortical breaches. We then inserted a 7.5 x 50 millimeter pedicle screw into the L4 and L5 pedicles bilaterally under fluoroscopic guidance. We then palpated along the medial aspect of the pedicles to rule out cortical breaches. There were none. The nerve roots were not injured. We then connected the unilateral pedicle screws with a lordotic rod. We compressed the construct and secured the rod in place with the caps. We then tightened the caps appropriately. This completed the instrumentation from L4-5.  We now turned our attention to the posterior lateral arthrodesis at L4-5. We used the high-speed drill to decorticate the remainder of the facets, pars, transverse process at L4-5. We then applied a combination of local morselized autograft bone and Zimmer DBM over these decorticated posterior lateral structures. This completed the posterior lateral arthrodesis.  We then obtained hemostasis using bipolar electrocautery. We irrigated the wound out with bacitracin solution. We inspected the thecal sac and nerve roots and noted they were well decompressed. We then removed the retractor.  We injected Exparel . We reapproximated patient's thoracolumbar fascia with interrupted #1 Vicryl suture. We reapproximated patient's subcutaneous tissue with interrupted 2-0 Vicryl suture. The reapproximated patient's skin with Steri-Strips and benzoin. The wound was then coated with bacitracin ointment. A sterile dressing was applied. The drapes were removed. The patient was subsequently returned to the supine position where they were extubated by the anesthesia team. He was then transported to the post anesthesia care unit in stable condition.  All sponge instrument and needle counts were reportedly correct at the end of this case.

## 2022-05-31 NOTE — Anesthesia Preprocedure Evaluation (Addendum)
Anesthesia Evaluation  Patient identified by MRN, date of birth, ID band Patient awake    Reviewed: Allergy & Precautions, NPO status , Patient's Chart, lab work & pertinent test results  Airway Mallampati: II  TM Distance: >3 FB Neck ROM: Full    Dental  (+) Dental Advisory Given, Missing,    Pulmonary neg pulmonary ROS,    Pulmonary exam normal breath sounds clear to auscultation       Cardiovascular + Valvular Problems/Murmurs MVP  Rhythm:Regular Rate:Tachycardia     Neuro/Psych PSYCHIATRIC DISORDERS Anxiety SPONDYLOLISTHESIS, LUMBAR REGION negative neurological ROS     GI/Hepatic Neg liver ROS, GERD  Medicated,  Endo/Other  negative endocrine ROS  Renal/GU negative Renal ROS     Musculoskeletal  (+) Arthritis ,   Abdominal   Peds  Hematology negative hematology ROS (+)   Anesthesia Other Findings Day of surgery medications reviewed with the patient.  Reproductive/Obstetrics                            Anesthesia Physical Anesthesia Plan  ASA: 2  Anesthesia Plan: General   Post-op Pain Management: Tylenol PO (pre-op)*   Induction: Intravenous  PONV Risk Score and Plan: 3 and Midazolam, Dexamethasone and Ondansetron  Airway Management Planned: Oral ETT  Additional Equipment:   Intra-op Plan:   Post-operative Plan: Extubation in OR  Informed Consent: I have reviewed the patients History and Physical, chart, labs and discussed the procedure including the risks, benefits and alternatives for the proposed anesthesia with the patient or authorized representative who has indicated his/her understanding and acceptance.     Dental advisory given  Plan Discussed with: CRNA  Anesthesia Plan Comments:         Anesthesia Quick Evaluation

## 2022-06-01 DIAGNOSIS — M4316 Spondylolisthesis, lumbar region: Secondary | ICD-10-CM | POA: Diagnosis not present

## 2022-06-01 MED ORDER — DOCUSATE SODIUM 100 MG PO CAPS: 100.0000 mg | ORAL_CAPSULE | Freq: Two times a day (BID) | ORAL | 0 refills | Status: DC

## 2022-06-01 MED ORDER — OXYCODONE-ACETAMINOPHEN 5-325 MG PO TABS: 1.0000 | ORAL_TABLET | ORAL | Status: DC | PRN

## 2022-06-01 MED ORDER — CYCLOBENZAPRINE HCL 5 MG PO TABS
5.0000 mg | ORAL_TABLET | Freq: Three times a day (TID) | ORAL | Status: DC | PRN
  Administered 2022-06-01: 5 mg via ORAL
  Filled 2022-06-01: qty 1

## 2022-06-01 MED ORDER — OXYCODONE-ACETAMINOPHEN 5-325 MG PO TABS: 1.0000 | ORAL_TABLET | ORAL | 0 refills | Status: DC | PRN

## 2022-06-01 NOTE — Discharge Summary (Signed)
Physician Discharge Summary  Patient ID: Rebekah Schmidt MRN: [id number] DOB/AGE: 1951/04/08 71 y.o.  Admit date: 05/31/2022 Discharge date: 06/01/2022  Admission Diagnoses: Lumbar spondylolisthesis, lumbar facet arthropathy, lumbar spinal stenosis, lumbar radiculopathy, neurogenic claudication, lumbago  Discharge Diagnoses: The same Principal Problem:   Spondylolisthesis of lumbar region   Discharged Condition: good  Hospital Course: I performed an L4-5 decompression, instrumentation and fusion on the patient on 05/31/2022.  The surgery went well.  The patient's postoperative course was unremarkable.  On postoperative day #1 she felt much better, her leg pain was gone, and she requested discharge home.  She was given verbal and written discharge instructions.  All her questions were answered.  Consults: PT, OT, care management Significant Diagnostic Studies: None Treatments: L4-5 decompression, instrumentation and fusion. Discharge Exam: Blood pressure (!) 140/77, pulse 93, temperature 98.5 F (36.9 C), temperature source Oral, resp. rate 16, height '5\' 5"'$  (1.651 m), weight 72.6 kg, SpO2 96 %. The patient is alert and pleasant.  Her dressing is clean and dry.  Her strength is normal.  She looks well.  Disposition: Home  Discharge Instructions     Call MD for:  difficulty breathing, headache or visual disturbances   Complete by: As directed    Call MD for:  extreme fatigue   Complete by: As directed    Call MD for:  hives   Complete by: As directed    Call MD for:  persistant dizziness or light-headedness   Complete by: As directed    Call MD for:  persistant nausea and vomiting   Complete by: As directed    Call MD for:  redness, tenderness, or signs of infection (pain, swelling, redness, odor or green/yellow discharge around incision site)   Complete by: As directed    Call MD for:  severe uncontrolled pain   Complete by: As directed    Call MD for:  temperature >100.4    Complete by: As directed    Diet - low sodium heart healthy   Complete by: As directed    Discharge instructions   Complete by: As directed    Call [phone] for a followup appointment. Take a stool softener while you are using pain medications.   Driving Restrictions   Complete by: As directed    Do not drive for 2 weeks.   Increase activity slowly   Complete by: As directed    Lifting restrictions   Complete by: As directed    Do not lift more than 5 pounds. No excessive bending or twisting.   May shower / Bathe   Complete by: As directed    Remove the dressing for 3 days after surgery.  You may shower, but leave the incision alone.   Remove dressing in 48 hours   Complete by: As directed       Allergies as of 06/01/2022       Reactions   Morphine And Related Itching        Medication List     STOP taking these medications    Advil PM 200-25 MG Caps Generic drug: Ibuprofen-diphenhydrAMINE HCl   ibuprofen 200 MG tablet Commonly known as: ADVIL       TAKE these medications    cyclobenzaprine 5 MG tablet Commonly known as: FLEXERIL Take 5 mg by mouth daily as needed for muscle spasms.   docusate sodium 100 MG capsule Commonly known as: COLACE Take 1 capsule (100 mg total) by mouth 2 (two) times daily.  fexofenadine 180 MG tablet Commonly known as: ALLEGRA Take 180 mg by mouth every evening.   hyoscyamine 0.125 MG tablet Commonly known as: LEVSIN Take 0.125 mg by mouth 2 (two) times daily as needed (abdominal spasms).   LORazepam 0.5 MG tablet Commonly known as: ATIVAN Take 0.5 mg by mouth 3 (three) times daily as needed for anxiety.   omeprazole 20 MG capsule Commonly known as: PRILOSEC Take 20 mg by mouth in the morning and at bedtime.   oxyCODONE-acetaminophen 5-325 MG tablet Commonly known as: PERCOCET/ROXICET Take 1-2 tablets by mouth every 4 (four) hours as needed for moderate pain.   PRESERVISION AREDS 2 PO Take 1 tablet by mouth  every evening.   SYSTANE OP Place 1 drop into both eyes 3 (three) times daily as needed (dry/irritated eyes.).   traMADol 50 MG tablet Commonly known as: ULTRAM Take 1-2 tablets (50-100 mg total) by mouth every 6 (six) hours as needed for moderate pain or severe pain (mild pain).   zolpidem 10 MG tablet Commonly known as: AMBIEN Take 10 mg by mouth at bedtime.         Signed: Ophelia Charter 06/01/2022, 7:50 AM

## 2022-06-01 NOTE — Progress Notes (Signed)
Patient alert and oriented, patient void, surgical site clean and dry no sign of infection. D/c instruction explain and given to the patient and daughter, all questions answered. Pt d/c home per order.

## 2022-06-01 NOTE — Progress Notes (Signed)
PT Cancellation Note  Patient Details Name: Rebekah Schmidt MRN: [id number] DOB: [birth year]   Cancelled Treatment:    Reason Eval/Treat Not Completed: PT screened, no needs identified, will sign off  Patient seen by OT with no further mobility needs identified. Seen walking in hallway with OT.    Arby Barrette, PT Acute Rehabilitation Services  Office [phone]   Rexanne Mano 06/01/2022, 8:50 AM

## 2022-06-01 NOTE — Plan of Care (Signed)

## 2022-06-01 NOTE — Evaluation (Signed)
Occupational Therapy Evaluation and Discharge from OT Patient Details Name: Rebekah Schmidt MRN: [id number] DOB: 1951-05-24 Today's Date: 06/01/2022   History of Present Illness Pt is a 71 y/o female admitted 05/31/22 for L4-5 decompression, instrumentation and fusion.  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   Clinical Impression   Pt is typically independent in ADL and mobility (uses a Rollator PRN for pain management pre-op). Today she is mod I for bed mobility with good demonstration of log roll technique. Back handout provided and reviewed adls in detail. Pt educated on: clothing between brace, never sleep in brace, set an alarm at night for medication, avoid sitting for long periods of time, avoiding lifting more than 5 pounds and never wash directly over incision. Pt was able to demonstrate sink level grooming (with education for compensatory strategies) at min guard/supervision level. Hallway moblity without DME at min guard level, transfers at supervision level, LB dressing at min A and don brace with min A. All education is complete and patient indicates understanding. OT will sign off at this time.       Recommendations for follow up therapy are one component of a multi-disciplinary discharge planning process, led by the attending physician.  Recommendations may be updated based on patient status, additional functional criteria and insurance authorization.   Follow Up Recommendations  No OT follow up    Assistance Recommended at Discharge PRN  Patient can return home with the following Assistance with cooking/housework;Assist for transportation;A little help with bathing/dressing/bathroom    Functional Status Assessment  Patient has had a recent  decline in their functional status and demonstrates the ability to make significant improvements in function in a reasonable and predictable amount of time.  Equipment Recommendations  None recommended by OT (Pt has appropriate DME)    Recommendations for Other Services       Precautions / Restrictions Precautions Precautions: Back Precaution Booklet Issued: Yes (comment) Precaution Comments: no brace for: sleeping, showering. OK to go to bathroom without brace Required Braces or Orthoses: Spinal Brace Spinal Brace: Applied in sitting position Restrictions Weight Bearing Restrictions: No      Mobility Bed Mobility Overal bed mobility: Modified Independent             General bed mobility comments: good demonstration of log roll technique    Transfers Overall transfer level: Needs assistance Equipment used: None Transfers: Sit to/from Stand Sit to Stand: Supervision           General transfer comment: from lower and higher surfaces      Balance Overall balance assessment: Mild deficits observed, not formally tested                                         ADL either performed or assessed with clinical judgement   ADL Overall ADL's : Needs assistance/impaired Eating/Feeding: Independent   Grooming: Supervision/safety;Standing Grooming Details (indicate cue type and reason): sink level, edcuated on compensatory strategies Upper Body Bathing: Supervision/ safety;Standing   Lower Body Bathing: Supervison/ safety;Sit to/from stand   Upper Body Dressing : Sitting;Minimal assistance Upper Body Dressing Details (indicate cue type and reason): to don brace Lower Body Dressing: Minimal assistance;Sit to/from stand;Cueing for compensatory techniques Lower Body Dressing Details (indicate cue  type and reason): educated on using her grabber/reacher for LB dressing Toilet Transfer: Min guard;Ambulation Toilet Transfer Details (indicate cue type and  reason): seeks environmental support Toileting- Water quality scientist and Hygiene: Min guard;Sitting/lateral lean   Tub/ Shower Transfer: Min guard;With caregiver independent assisting;Ambulation;Tub transfer Tub/Shower Transfer Details (indicate cue type and reason): educated to brace against the wall Functional mobility during ADLs: Min guard;Supervision/safety (progressed to supervision) General ADL Comments: educated on everything in back precautions handout     Vision Ability to See in Adequate Light: 0 Adequate Patient Visual Report: No change from baseline Vision Assessment?: No apparent visual deficits     Perception     Praxis      Pertinent Vitals/Pain Pain Assessment Pain Assessment: 0-10 Pain Score: 1  Pain Location: surgical site Pain Descriptors / Indicators: Dull Pain Intervention(s): Monitored during session, Repositioned     Hand Dominance Right   Extremity/Trunk Assessment Upper Extremity Assessment Upper Extremity Assessment: Overall WFL for tasks assessed   Lower Extremity Assessment Lower Extremity Assessment: Overall WFL for tasks assessed   Cervical / Trunk Assessment Cervical / Trunk Assessment: Back Surgery   Communication Communication Communication: No difficulties   Cognition Arousal/Alertness: Awake/alert Behavior During Therapy: WFL for tasks assessed/performed Overall Cognitive Status: Within Functional Limits for tasks assessed                                       General Comments       Exercises     Shoulder Instructions      Home Living Family/patient expects to be discharged to:: Private residence Living Arrangements: Alone;Other (Comment) (3 cats, 2 dogs) Available Help at Discharge: Family;Available 24 hours/day (daughter Investment banker, corporate) and granddaughter (76) staying with her at least a week) Type of Home: House Home Access: Level entry     Home Layout: One level (sunken living room)     Bathroom Shower/Tub:  Teacher, early years/pre: Handicapped height Bathroom Accessibility: Yes How Accessible: Accessible via walker Home Equipment: Rollator (4 wheels);Grab bars - tub/shower;Hand held shower head;Adaptive equipment Adaptive Equipment: Reacher        Prior Functioning/Environment Prior Level of Function : Independent/Modified Independent;Driving             Mobility Comments: Rollator for comfort          OT Problem List: Decreased knowledge of use of DME or AE;Decreased knowledge of precautions;Pain      OT Treatment/Interventions:      OT Goals(Current goals can be found in the care plan section) Acute Rehab OT Goals Patient Stated Goal: get back to where she can "get up and go" OT Goal Formulation: With patient Time For Goal Achievement: 06/15/22 Potential to Achieve Goals: Good  OT Frequency:      Co-evaluation              AM-PAC OT "6 Clicks" Daily Activity     Outcome Measure Help from another person eating meals?: None Help from another person taking care of personal grooming?: None Help from another person toileting, which includes using toliet, bedpan, or urinal?: None Help from another person bathing (including washing, rinsing, drying)?: A Little Help from another person to put on and taking off regular upper body clothing?: None Help from another person to put on and taking off regular lower body clothing?: A Little 6 Click Score: 22   End of Session Nurse  Communication: Mobility status  Activity Tolerance: Patient tolerated treatment well Patient left: in chair;with call bell/phone within reach  OT Visit Diagnosis: Muscle weakness (generalized) (M62.81)                Time: 2409-7353 OT Time Calculation (min): 22 min Charges:  OT General Charges $OT Visit: 1 Visit OT Evaluation $OT Eval Low Complexity: Remy OTR/L Acute Rehabilitation Services Office: [place] 06/01/2022, 9:03 AM

## 2022-06-02 MED FILL — Thrombin For Soln 5000 Unit: CUTANEOUS | Qty: 5000 | Status: AC

## 2022-06-03 MED FILL — Heparin Sodium (Porcine) Inj 1000 Unit/ML: INTRAMUSCULAR | Qty: 30 | Status: AC

## 2022-06-03 MED FILL — Sodium Chloride IV Soln 0.9%: INTRAVENOUS | Qty: 1000 | Status: AC

## 2023-09-06 LAB — LAB REPORT - SCANNED
A1c: 6.2
EGFR: 76
TSH: 3.18 (ref 0.41–5.90)

## 2023-10-07 ENCOUNTER — Other Ambulatory Visit (INDEPENDENT_AMBULATORY_CARE_PROVIDER_SITE_OTHER): Payer: Medicare Other

## 2023-10-07 ENCOUNTER — Ambulatory Visit: Payer: Medicare Other | Admitting: Internal Medicine

## 2023-10-07 ENCOUNTER — Encounter: Payer: Self-pay | Admitting: Internal Medicine

## 2023-10-07 VITALS — BP 130/72 | HR 103 | Ht 65.0 in | Wt 166.2 lb

## 2023-10-07 DIAGNOSIS — R748 Abnormal levels of other serum enzymes: Secondary | ICD-10-CM | POA: Diagnosis not present

## 2023-10-07 DIAGNOSIS — R1011 Right upper quadrant pain: Secondary | ICD-10-CM

## 2023-10-07 DIAGNOSIS — R194 Change in bowel habit: Secondary | ICD-10-CM

## 2023-10-07 DIAGNOSIS — R1013 Epigastric pain: Secondary | ICD-10-CM

## 2023-10-07 LAB — HEPATIC FUNCTION PANEL
ALT: 16 U/L (ref 0–35)
AST: 17 U/L (ref 0–37)
Albumin: 4.2 g/dL (ref 3.5–5.2)
Alkaline Phosphatase: 128 U/L — ABNORMAL HIGH (ref 39–117)
Bilirubin, Direct: 0.1 mg/dL (ref 0.0–0.3)
Total Bilirubin: 0.4 mg/dL (ref 0.2–1.2)
Total Protein: 7.7 g/dL (ref 6.0–8.3)

## 2023-10-07 LAB — GAMMA GT: GGT: 29 U/L (ref 7–51)

## 2023-10-07 MED ORDER — SUFLAVE 178.7 G PO SOLR: 1.0000 | Freq: Once | ORAL | 0 refills | Status: AC

## 2023-10-07 NOTE — Patient Instructions (Signed)
Your provider has requested that you go to the basement level for lab work before leaving today. Press "B" on the elevator. The lab is located at the first door on the left as you exit the elevator.  You have been scheduled for an abdominal ultrasound at [facility] (1st floor of hospital) on 10/13/23 at 10:00am. Please arrive 30 minutes prior to your appointment for registration. Make certain not to have anything to eat or drink 6 hours prior to your appointment. Should you need to reschedule your appointment, please contact radiology at [phone]. This test typically takes about 30 minutes to perform.  You have been scheduled for an endoscopy and colonoscopy. Please follow the written instructions given to you at your visit today.  If you use inhalers (even only as needed), please bring them with you on the day of your procedure.  DO NOT TAKE 7 DAYS PRIOR TO TEST- Trulicity (dulaglutide) Ozempic, Wegovy (semaglutide) Mounjaro (tirzepatide) Bydureon Bcise (exanatide extended release)  DO NOT TAKE 1 DAY PRIOR TO YOUR TEST Rybelsus (semaglutide) Adlyxin (lixisenatide) Victoza (liraglutide) Byetta (exanatide) ___________________________________________________________________________  Bonita Quin will receive your bowel preparation through [facility], which ensures the lowest copay and home delivery, with outreach via text or call from an [phone] number. Please respond promptly to avoid rescheduling of your procedure. If you are interested in alternative options or have any questions regarding your prep, please contact them at [phone] ____________________________________________________________________________  Your Provider Has Sent Your Bowel Prep Regimen To [facility]   [facility] will contact you to verify your information and collect your copay, if applicable. Enjoy the comfort of your home while your prescription is mailed to you, FREE of any shipping charges.   [facility]  accepts all major insurance benefits and applies discounts & coupons.  Have additional questions?   Chat: [email or web address] Call: [phone] Email: [email or web address] NCPDP: [id number]  How will [facility] contact you?  With a Welcome phone call,  a Welcome text and a checkout link in text form.  Texts you receive from [phone] Are NOT Spam.  *To set up delivery, you must complete the checkout process via link or speak to one of the patient care representatives. If [facility] is unable to reach you, your prescription may be delayed.  To avoid long hold times on the phone, you may also utilize the secure chat feature on the [facility] website to request that they call you back for transaction completion or to expedite your concerns.  _______________________________________________________  If your blood pressure at your visit was 140/90 or greater, please contact your primary care physician to follow up on this.  _______________________________________________________  If you are age 47 or older, your body mass index should be between 23-30. Your Body mass index is 27.66 kg/m. If this is out of the aforementioned range listed, please consider follow up with your Primary Care Provider.  If you are age 73 or younger, your body mass index should be between 19-25. Your Body mass index is 27.66 kg/m. If this is out of the aformentioned range listed, please consider follow up with your Primary Care Provider.   ________________________________________________________  The [facility] providers would like to encourage you to use [facility] to communicate with providers for non-urgent requests or questions.  Due to long hold times on the telephone, sending your provider a message by [facility] may be a faster and more efficient way to get a response.  Please allow 48 business hours for a response.  Please remember that this is for non-urgent  requests.   _______________________________________________________

## 2023-10-07 NOTE — Progress Notes (Addendum)
 Rebekah Schmidt 73 y.o. [birth year] [id number]  Assessment & Plan:   Encounter Diagnoses  Name Primary?   Abnormal alkaline phosphatase test Yes   Change in bowel habits    RUQ pain    Abdominal pain, epigastric    Laboratory and imaging evaluation as below for her symptoms and abnormal alkaline phosphatase test.  Schedule EGD and colonoscopy to evaluate upper abdominal pain issues and change in bowel habits.  I do think it is quite likely she has some sort of disordered defecation and pelvic floor dysfunction since she has urinary incontinence enough to wear depends and based upon her description of her constipation and defecation problems.  She has significant stress over the potential for serious medical illness causing this so we have elected to do an aggressive workup.  The risks and benefits as well as alternatives of endoscopic procedure(s) have been discussed and reviewed. All questions answered. The patient agrees to proceed.  Orders Placed This Encounter  Procedures   US  Abdomen Limited RUQ (LIVER/GB)   Hepatic function panel   Mitochondrial antibodies   ANA   Gamma GT   Nucleotidase, 5', blood   Ambulatory referral to Gastroenterology   US  raised ? 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 Hershell Lose EGD 07/05/2017 GERD symptoms-benign fundic gland polyps otherwise normal   Chief Complaint: Constipation abdominal pain and bloating  Discussed the use of AI scribe software for clinical note transcription with the patient,  who gave verbal consent to proceed.  History of Present Illness   Rebekah Schmidt is a 73 year old female with a history of colon stricture and surgery who presents with constipation and abdominal pain.  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  She did well for some time after that.  She also has a history of an EGD in 2018 showing benign fundic gland polyps and an irregular Z-line (GERD was the indication).  After her surgery her husband became ill with lung cancer that was metastatic and there was a 3-year time period where he required significant help and care and she says she neglected her health.  She experiences constipation characterized by a sensation of fullness and difficulty with bowel movements. If she does not have a bowel movement daily, she uses a suppository or chocolate Exlax. Her stools are irregular with 'holes or something in the sides' and not smooth. Miralax Clear did not alleviate her symptoms and seemed to worsen her constipation.  She describes abdominal pain in the right upper quadrant, typically occurring after eating or drinking but can also appear spontaneously. This pain is associated with bloating, feeling like she 'blows up like a toad' after swallowing. She has a history of gallbladder removal approximately 20 years ago.  She frequently feels tired and fatigued, which is unusual for her as she describes herself as an active person. She has not had any imaging studies of her liver, but her liver function tests show a slightly elevated alkaline  phosphatase level.  She experiences intermittent itching, primarily on her head and joints. No chronic or persistent itchiness.  She reports urinary symptoms including leakage when coughing or sneezing and occasional urgency. She has had a couple of urinary tract infections in the past year and wears protective undergarments due to urinary leakage.     Lab review  09/05/2023 with normal CBC except for slightly elevated lymphocytes, but hemoglobin 13 hematocrit 40 RDW 11.9 platelets 363.  CMP with glucose 100 otherwise normal labs except  alkaline phosphatase 150.  It was 177 1 year prior.  Transaminases normal.  TSH normal. Allergies  Allergen Reactions   Morphine And Codeine Itching   Current Meds  Medication Sig   hyoscyamine (LEVSIN, ANASPAZ) 0.125 MG tablet Take 0.125 mg by mouth 2 (two) times daily as needed (abdominal spasms).   LORazepam (ATIVAN) 0.5 MG tablet Take 0.5 mg by mouth 3 (three) times daily as needed for anxiety.    metoprolol succinate (TOPROL-XL) 25 MG 24 hr tablet Take 25 mg by mouth daily.   Multiple Vitamins-Minerals (PRESERVISION AREDS 2 PO) Take 1 tablet by mouth every evening.   omeprazole (PRILOSEC) 20 MG capsule Take 20 mg by mouth in the morning and at bedtime.   Polyethyl Glycol-Propyl Glycol (SYSTANE OP) Place 1 drop into both eyes 3 (three) times daily as needed (dry/irritated eyes.).   zolpidem (AMBIEN) 10 MG tablet Take 10 mg by mouth at bedtime.    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   Walking pneumonia    approx 25 years ago in [facility]   Past Surgical History:  Procedure Laterality Date   ABDOMINAL HYSTERECTOMY  1999   ANKLE SURGERY     left ankle with 2 plates and screws   APPENDECTOMY     BACK SURGERY  05/2022   CHOLECYSTECTOMY     COLONOSCOPY     x 2 2007 was Negative, last one was on 05/22/18   ESOPHAGOGASTRODUODENOSCOPY  04/2011   Hiatal  hernia with esophagitis no Barrett's, repeat 2018 - fundic gland polyps   PROCTOSCOPY N/A 09/06/2018   Procedure: RIGID PROCTOSCOPY;  Surgeon: Candyce Champagne, MD;  Location: [facility];  Service: General;  Laterality: N/A;   TUBAL LIGATION     UPPER GASTROINTESTINAL ENDOSCOPY  06/24/2017   Social History   Social History Narrative   Married and lives with her husband in [place], her one daughter is a Engineer, civil (consulting) in [place].   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   Review of Systems  As per HPI.  Otherwise negative. Objective:  Physical Exam @BP  130/72   Pulse (!) 103   Ht 5\' 5"  (1.651 m)   Wt 166 lb 3.2 oz (75.4 kg)   SpO2 95%   BMI 27.66 kg/m @  General:  Well-developed, well-nourished and in no acute distress Eyes:  anicteric. Lungs: Clear to auscultation bilaterally. Heart:   S1S2, no rubs, murmurs, gallops. Abdomen:  soft, non-tender, no hepatosplenomegaly, hernia, or mass and BS+.  Rectal: Female staff present   Anoderm inspection revealed no abnormalities Anal wink was absent Digital exam revealed decreased resting tone and voluntary squeeze. No mass or rectocele present. Simulated defecation with valsalva revealed appropriate abdominal contraction and descent.     Extremities:   no edema, cyanosis or clubbing Skin   Excoriated papular rash trunk + diffusely dry skin Neuro:  A&O x 3.  Psych:  Appears anxious "I will feel better if I know everything is ok"   Data Reviewed: See HPI

## 2023-10-13 ENCOUNTER — Ambulatory Visit (HOSPITAL_COMMUNITY)
Admission: RE | Admit: 2023-10-13 | Discharge: 2023-10-13 | Disposition: A | Discharge status: Home / Self Care | Payer: Medicare Other | Source: Ambulatory Visit | Attending: Internal Medicine | Admitting: Internal Medicine

## 2023-10-13 DIAGNOSIS — R1013 Epigastric pain: Secondary | ICD-10-CM

## 2023-10-13 DIAGNOSIS — R194 Change in bowel habit: Secondary | ICD-10-CM

## 2023-10-13 DIAGNOSIS — R748 Abnormal levels of other serum enzymes: Secondary | ICD-10-CM | POA: Diagnosis present

## 2023-10-13 DIAGNOSIS — R1011 Right upper quadrant pain: Secondary | ICD-10-CM | POA: Diagnosis present

## 2023-10-13 LAB — NUCLEOTIDASE, 5', BLOOD: 5-Nucleotidase: 5 U/L (ref 0–10)

## 2023-10-13 LAB — MITOCHONDRIAL ANTIBODIES: Mitochondrial M2 Ab, IgG: <=20 U (ref <=20.0)

## 2023-10-13 LAB — ANA: Anti Nuclear Antibody (ANA): NEGATIVE

## 2023-10-16 ENCOUNTER — Encounter: Payer: Self-pay | Admitting: Internal Medicine

## 2023-10-19 ENCOUNTER — Encounter: Payer: Self-pay | Admitting: Internal Medicine

## 2023-11-28 NOTE — Progress Notes (Unsigned)
 [facility] History and Physical   Primary Care Physician:  Maximiano Coss, MD   Reason for Procedure:   Epigastric + RUQ pain and change in bowel habits  Plan:    EGD and colonoscopy     HPI: Rebekah Schmidt is a 73 y.o. female seen 10/07/2023 and was c/o of the above problems. She has pelvic organ prolapse that is likely involved but we scheduled these procedures to evaluate for other abnormalities that could be related. 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   Walking pneumonia    approx 25 years ago in [facility]    Past Surgical History:  Procedure Laterality Date   ABDOMINAL HYSTERECTOMY  1999   ANKLE SURGERY     left ankle with 2 plates and screws   APPENDECTOMY     BACK SURGERY  05/2022   CHOLECYSTECTOMY     COLONOSCOPY     x 2 2007 was Negative, last one was on 05/22/18   ESOPHAGOGASTRODUODENOSCOPY  04/2011   Hiatal hernia with esophagitis no Barrett's, repeat 2018 - fundic gland polyps   PROCTOSCOPY N/A 09/06/2018   Procedure: RIGID PROCTOSCOPY;  Surgeon: Karie Soda, MD;  Location: [facility];   Service: General;  Laterality: N/A;   TUBAL LIGATION     UPPER GASTROINTESTINAL ENDOSCOPY  06/24/2017    Prior to Admission medications   Medication Sig Start Date End Date Taking? Authorizing Provider  cyclobenzaprine (FLEXERIL) 5 MG tablet Take 5 mg by mouth daily as needed for muscle spasms. Patient not taking: Reported on 10/07/2023    [provider]  fexofenadine (ALLEGRA) 180 MG tablet Take 180 mg by mouth every evening. Patient not taking: Reported on 10/07/2023    [provider]  hyoscyamine (LEVSIN, ANASPAZ) 0.125 MG tablet Take 0.125 mg by mouth 2 (two) times daily as needed (abdominal spasms).    [provider]  LORazepam (ATIVAN) 0.5 MG tablet Take 0.5 mg by mouth 3 (three) times daily as needed for anxiety.  12/14/16   [provider]  metoprolol succinate (TOPROL-XL) 25 MG 24 hr tablet Take 25 mg by mouth daily. 06/18/23   [provider]  Multiple Vitamins-Minerals (PRESERVISION AREDS 2 PO) Take 1 tablet by mouth every evening.    [provider]  omeprazole (PRILOSEC) 20 MG capsule Take 20 mg by mouth in the morning and at bedtime.    [provider]  Polyethyl Glycol-Propyl Glycol (SYSTANE OP) Place 1 drop into both eyes 3 (three) times daily as needed (dry/irritated eyes.).  [provider]  zolpidem (AMBIEN) 10 MG tablet Take 10 mg by mouth at bedtime.  11/06/16   [provider]    Current Outpatient Medications  Medication Sig Dispense Refill   cyclobenzaprine (FLEXERIL) 5 MG tablet Take 5 mg by mouth daily as needed for muscle spasms. (Patient not taking: Reported on 10/07/2023)     fexofenadine (ALLEGRA) 180 MG tablet Take 180 mg by mouth every evening. (Patient not taking: Reported on 10/07/2023)     hyoscyamine (LEVSIN, ANASPAZ) 0.125 MG tablet Take 0.125 mg by mouth 2 (two) times daily as needed (abdominal spasms).     LORazepam (ATIVAN) 0.5 MG tablet Take 0.5 mg by mouth 3 (three) times  daily as needed for anxiety.      metoprolol succinate (TOPROL-XL) 25 MG 24 hr tablet Take 25 mg by mouth daily.     Multiple Vitamins-Minerals (PRESERVISION AREDS 2 PO) Take 1 tablet by mouth every evening.     omeprazole (PRILOSEC) 20 MG capsule Take 20 mg by mouth in the morning and at bedtime.     Polyethyl Glycol-Propyl Glycol (SYSTANE OP) Place 1 drop into both eyes 3 (three) times daily as needed (dry/irritated eyes.).     zolpidem (AMBIEN) 10 MG tablet Take 10 mg by mouth at bedtime.      No current facility-administered medications for this visit.    Allergies as of 11/29/2023 - Review Complete 10/07/2023  Allergen Reaction Noted   Morphine and codeine Itching 05/18/2018    Family History  Problem Relation Age of Onset   Stomach cancer Neg Hx    Colon cancer Neg Hx    Colon polyps Neg Hx    Esophageal cancer Neg Hx    Rectal cancer Neg Hx     Social History   Socioeconomic History   Marital status: Widowed    Spouse name: Not on file   Number of children: 1   Years of education: Not on file   Highest education level: Not on file  Occupational History   Occupation: retired  Tobacco Use   Smoking status: Never   Smokeless tobacco: Never  Vaping Use   Vaping status: Never Used  Substance and Sexual Activity   Alcohol use: Not Currently    Comment: wine very occasional   Drug use: No   Sexual activity: Not Currently    Partners: Male    Birth control/protection: Surgical    Comment: Hysterectomy  Other Topics Concern   Not on file  Social History Narrative   Married and lives with her husband in [place], her one daughter is a Engineer, civil (consulting) in [place].   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  Physical Exam: Vital signs There were no vitals taken for this visit.  General:   Alert,  Well-developed, well-nourished,  pleasant and cooperative in NAD Lungs:  Clear throughout to auscultation.   Heart:  Regular rate and rhythm; no murmurs, clicks, rubs,  or gallops. Abdomen:  Soft, nontender and nondistended. Normal bowel sounds.   Neuro/Psych:  Alert and cooperative. Normal mood and affect. A and O x 3   @Elsbeth Yearick  Tammie Fall, MD, [facility] [phone] (pager) 11/28/2023 9:07 PM@

## 2023-11-29 ENCOUNTER — Ambulatory Visit: Payer: Medicare Other | Admitting: Internal Medicine

## 2023-11-29 ENCOUNTER — Encounter: Payer: Self-pay | Admitting: Internal Medicine

## 2023-11-29 VITALS — BP 142/80 | HR 82 | Temp 97.4°F | Resp 16 | Ht 65.0 in | Wt 166.0 lb

## 2023-11-29 DIAGNOSIS — K3189 Other diseases of stomach and duodenum: Secondary | ICD-10-CM

## 2023-11-29 DIAGNOSIS — R1013 Epigastric pain: Secondary | ICD-10-CM

## 2023-11-29 DIAGNOSIS — K6289 Other specified diseases of anus and rectum: Secondary | ICD-10-CM | POA: Diagnosis not present

## 2023-11-29 DIAGNOSIS — K644 Residual hemorrhoidal skin tags: Secondary | ICD-10-CM | POA: Diagnosis not present

## 2023-11-29 DIAGNOSIS — K319 Disease of stomach and duodenum, unspecified: Secondary | ICD-10-CM

## 2023-11-29 DIAGNOSIS — K5730 Diverticulosis of large intestine without perforation or abscess without bleeding: Secondary | ICD-10-CM | POA: Diagnosis not present

## 2023-11-29 DIAGNOSIS — R194 Change in bowel habit: Secondary | ICD-10-CM

## 2023-11-29 DIAGNOSIS — D123 Benign neoplasm of transverse colon: Secondary | ICD-10-CM

## 2023-11-29 MED ORDER — SODIUM CHLORIDE 0.9 % IV SOLN: 500.0000 mL | Freq: Once | INTRAVENOUS | Status: DC

## 2023-11-29 NOTE — Op Note (Signed)
 [facility] Patient Name: Rebekah Schmidt Procedure Date: 11/29/2023 11:00 AM MRN: [id number] Endoscopist: Iva Boop , MD, [id number] Age: 73 Referring MD:  Date of Birth: 1950/10/26 Gender: Female Account #: [id number] Procedure:                Colonoscopy Indications:              Change in bowel habits Medicines:                Monitored Anesthesia Care Procedure:                Pre-Anesthesia Assessment:                           - Prior to the procedure, a History and Physical                            was performed, and patient medications and                            allergies were reviewed. The patient's tolerance of                            previous anesthesia was also reviewed. The risks                            and benefits of the procedure and the sedation                            options and risks were discussed with the patient.                            All questions were answered, and informed consent                            was obtained. Prior Anticoagulants: The patient has                            taken no anticoagulant or antiplatelet agents. ASA                            Grade Assessment: II - A patient with mild systemic                            disease. After reviewing the risks and benefits,                            the patient was deemed in satisfactory condition to                            undergo the procedure.                           After obtaining informed consent, the colonoscope  was passed under direct vision. Throughout the                            procedure, the patient's blood pressure, pulse, and                            oxygen saturations were monitored continuously. The                            Olympus Scope SN: [id number] was introduced through                            the anus and advanced to the the cecum, identified                            by appendiceal orifice and  ileocecal valve. The                            colonoscopy was performed without difficulty. The                            patient tolerated the procedure well. The quality                            of the bowel preparation was good. The ileocecal                            valve, appendiceal orifice, and rectum were                            photographed. The bowel preparation used was                            SUFLAVE via split dose instruction. Scope In: 11:19:54 AM Scope Out: 11:30:33 AM Scope Withdrawal Time: 0 hours 7 minutes 25 seconds  Total Procedure Duration: 0 hours 10 minutes 39 seconds  Findings:                 The digital rectal exam findings include decreased                            sphincter tone.                           A 3 mm polyp was found in the transverse colon. The                            polyp was sessile. The polyp was removed with a                            cold snare. Resection and retrieval were complete.                            Verification of patient identification for the  specimen was done. Estimated blood loss was minimal.                           Multiple diverticula were found in the descending                            colon.                           There was evidence of a prior end-to-side                            colo-colonic anastomosis in the rectum. This was                            patent and was characterized by healthy appearing                            mucosa. The anastomosis was traversed.                           External hemorrhoids were found. The hemorrhoids                            were small.                           The exam was otherwise without abnormality on                            direct and retroflexion views. Complications:            No immediate complications. Estimated Blood Loss:     Estimated blood loss was minimal. Impression:               - Decreased sphincter  tone found on digital rectal                            exam.                           - One 3 mm polyp in the transverse colon, removed                            with a cold snare. Resected and retrieved.                           - Diverticulosis in the descending colon.                           - Patent end-to-side colo-colonic anastomosis,                            characterized by healthy appearing mucosa.                           - External hemorrhoids.                           -  The examination was otherwise normal on direct                            and retroflexion views. Recommendation:           - Patient has a contact number available for                            emergencies. The signs and symptoms of potential                            delayed complications were discussed with the                            patient. Return to normal activities tomorrow.                            Written discharge instructions were provided to the                            patient.                           - Lower carbohydrate (reduce sugars and starches                            but not fiber).                           - Continue present medications.                           - Await pathology results.                           - No recommendation at this time regarding repeat                            colonoscopy due to age. Kenney Peacemaker, MD 11/29/2023 12:00:41 PM This report has been signed electronically.

## 2023-11-29 NOTE — Op Note (Signed)
 [facility] Patient Name: Rebekah Schmidt Procedure Date: 11/29/2023 11:01 AM MRN: [id number] Endoscopist: Iva Boop , MD, [id number] Age: 73 Referring MD:  Date of Birth: 28-Mar-1951 Gender: Female Account #: [id number] Procedure:                Upper GI endoscopy Indications:              Epigastric abdominal pain, Abdominal pain in the                            right upper quadrant Medicines:                Monitored Anesthesia Care Procedure:                Pre-Anesthesia Assessment:                           - Prior to the procedure, a History and Physical                            was performed, and patient medications and                            allergies were reviewed. The patient's tolerance of                            previous anesthesia was also reviewed. The risks                            and benefits of the procedure and the sedation                            options and risks were discussed with the patient.                            All questions were answered, and informed consent                            was obtained. Prior Anticoagulants: The patient has                            taken no anticoagulant or antiplatelet agents. ASA                            Grade Assessment: II - A patient with mild systemic                            disease. After reviewing the risks and benefits,                            the patient was deemed in satisfactory condition to                            undergo the procedure.  After obtaining informed consent, the endoscope was                            passed under direct vision. Throughout the                            procedure, the patient's blood pressure, pulse, and                            oxygen saturations were monitored continuously. The                            GIF [id number] #[id number] was introduced through the                            mouth, and advanced to the second  part of duodenum.                            The upper GI endoscopy was accomplished without                            difficulty. The patient tolerated the procedure                            well. Scope In: Scope Out: Findings:                 Striped mildly erythematous mucosa without bleeding                            was found in the gastric antrum. Biopsies were                            taken with a cold forceps for histology.                            Verification of patient identification for the                            specimen was done. Estimated blood loss was minimal.                           The gastroesophageal flap valve was visualized                            endoscopically and classified as Hill Grade III                            (minimal fold, loose to endoscope, hiatal hernia                            likely).                           The exam was otherwise without abnormality.  The cardia and gastric fundus were otherwise normal                            on retroflexion. Complications:            No immediate complications. Estimated Blood Loss:     Estimated blood loss was minimal. Impression:               - Erythematous mucosa in the antrum. Biopsied.                           - Gastroesophageal flap valve classified as Hill                            Grade III (minimal fold, loose to endoscope, hiatal                            hernia likely).                           - The examination was otherwise normal. Recommendation:           - Patient has a contact number available for                            emergencies. The signs and symptoms of potential                            delayed complications were discussed with the                            patient. Return to normal activities tomorrow.                            Written discharge instructions were provided to the                            patient.                            - Resume previous diet.                           - Continue present medications.                           - Await pathology results.                           - See the other procedure note for documentation of                            additional recommendations.                           Lower carb intake to help suspected fatty liver -  Fib 4 was 0.8 so do not suspect advanced fibrosis Kenney Peacemaker, MD 11/29/2023 11:51:22 AM This report has been signed electronically.

## 2023-11-29 NOTE — Progress Notes (Signed)
 Sedate, gd SR, tolerated procedure well, VSS, report to RN

## 2023-11-29 NOTE — Progress Notes (Signed)
 Called to room to assist during endoscopic procedure.  Patient ID and intended procedure confirmed with present staff. Received instructions for my participation in the procedure from the performing physician.

## 2023-11-29 NOTE — Patient Instructions (Addendum)
 The upper endoscopy main finding was some red color change in the stomach - biopsied as sometimes this means an infection.  There was a tiny colon polyp removed. It will be analyzed. I will let you know pathology results and when to have another routine colonoscopy by mail and/or My Chart.  I saw some residual diverticulosis but no other significant findings. Surgery site was fine.  I am recommending you start with 1 teaspoon and work up to 1 tablespoon daily of psyllium husk (aka Metamucil) daily for constipation.  You should also consider pelvic floor physical therapy to help your urination symptoms and the constipation. If you want I can refer you to a PT in [place] - I am not aware of what is available in [place] - you could ask Dr. Norval Gable or your gynecologist about this.  I used your lab findings in a calculator for fatty liver assessment and the result was that there should not be any major scarring of the liver at this time.  It is recommended that you reduce and really try to eliminate any intake of sugary sweetened beverages and avoid starches, breads and processed foods. This can help you lose belly fat and improve the liver and overall health.  I appreciate the opportunity to care for you. Iva Boop, MD, FACG   YOU HAD AN ENDOSCOPIC PROCEDURE TODAY AT THE  ENDOSCOPY CENTER:   Refer to the procedure report that was given to you for any specific questions about what was found during the examination.  If the procedure report does not answer your questions, please call your gastroenterologist to clarify.  If you requested that your care partner not be given the details of your procedure findings, then the procedure report has been included in a sealed envelope for you to review at your convenience later.  YOU SHOULD EXPECT: Some feelings of bloating in the abdomen. Passage of more gas than usual.  Walking can help get rid of the air that was put into your GI tract  during the procedure and reduce the bloating. If you had a lower endoscopy (such as a colonoscopy or flexible sigmoidoscopy) you may notice spotting of blood in your stool or on the toilet paper. If you underwent a bowel prep for your procedure, you may not have a normal bowel movement for a few days.  Please Note:  You might notice some irritation and congestion in your nose or some drainage.  This is from the oxygen used during your procedure.  There is no need for concern and it should clear up in a day or so.  SYMPTOMS TO REPORT IMMEDIATELY:  Following lower endoscopy (colonoscopy or flexible sigmoidoscopy):  Excessive amounts of blood in the stool  Significant tenderness or worsening of abdominal pains  Swelling of the abdomen that is new, acute  Fever of 100F or higher  Following upper endoscopy (EGD)  Vomiting of blood or coffee ground material  New chest pain or pain under the shoulder blades  Painful or persistently difficult swallowing  New shortness of breath  Fever of 100F or higher  Black, tarry-looking stools  For urgent or emergent issues, a gastroenterologist can be reached at any hour by calling [phone]. Do not use MyChart messaging for urgent concerns.    DIET:  We do recommend a small meal at first, but then you may proceed to your regular diet.  Drink plenty of fluids but you should avoid alcoholic beverages for 24 hours.  ACTIVITY:  You should plan to take it easy for the rest of today and you should NOT DRIVE or use heavy machinery until tomorrow (because of the sedation medicines used during the test).    FOLLOW UP: Our staff will call the number listed on your records the next business day following your procedure.  We will call around 7:15- 8:00 am to check on you and address any questions or concerns that you may have regarding the information given to you following your procedure. If we do not reach you, we will leave a message.     If any biopsies  were taken you will be contacted by phone or by letter within the next 1-3 weeks.  Please call us  at [phone] if you have not heard about the biopsies in 3 weeks.    SIGNATURES/CONFIDENTIALITY: You and/or your care partner have signed paperwork which will be entered into your electronic medical record.  These signatures attest to the fact that that the information above on your After Visit Summary has been reviewed and is understood.  Full responsibility of the confidentiality of this discharge information lies with you and/or your care-partner.

## 2023-11-30 ENCOUNTER — Telehealth: Payer: Self-pay

## 2023-11-30 NOTE — Telephone Encounter (Signed)
 Follow up call. LVM for patient to call Dr. Jadene Maxwell office if any issues.

## 2023-12-01 LAB — SURGICAL PATHOLOGY

## 2023-12-04 ENCOUNTER — Encounter: Payer: Self-pay | Admitting: Internal Medicine

## 2024-01-18 ENCOUNTER — Encounter: Payer: Self-pay | Admitting: Allergy & Immunology

## 2024-01-18 ENCOUNTER — Other Ambulatory Visit: Payer: Self-pay

## 2024-01-18 ENCOUNTER — Ambulatory Visit (INDEPENDENT_AMBULATORY_CARE_PROVIDER_SITE_OTHER): Admitting: Allergy & Immunology

## 2024-01-18 VITALS — BP 130/68 | HR 98 | Temp 98.0°F | Resp 18 | Ht 64.17 in | Wt 163.0 lb

## 2024-01-18 DIAGNOSIS — L299 Pruritus, unspecified: Secondary | ICD-10-CM

## 2024-01-18 MED ORDER — PREDNISONE 10 MG PO TABS: ORAL_TABLET | ORAL | 0 refills | Status: DC

## 2024-01-18 MED ORDER — LEVOCETIRIZINE DIHYDROCHLORIDE 5 MG PO TABS: 5.0000 mg | ORAL_TABLET | Freq: Two times a day (BID) | ORAL | 5 refills | Status: DC

## 2024-01-18 MED ORDER — TRIAMCINOLONE ACETONIDE 0.1 % EX CREA: 1.0000 | TOPICAL_CREAM | Freq: Two times a day (BID) | CUTANEOUS | 0 refills | Status: DC

## 2024-01-18 MED ORDER — PIMECROLIMUS 1 % EX CREA: TOPICAL_CREAM | Freq: Two times a day (BID) | CUTANEOUS | 5 refills | Status: DC

## 2024-01-18 MED ORDER — FAMOTIDINE 20 MG PO TABS: 20.0000 mg | ORAL_TABLET | Freq: Two times a day (BID) | ORAL | 5 refills | Status: DC

## 2024-01-18 NOTE — Progress Notes (Unsigned)
 NEW PATIENT  Date of Service/Encounter:  01/18/24  Consult requested by: Huston Maiers, MD   Assessment:   Itching - unclear etiology  Plan/Recommendations:   1. Itching - Your history does not have any "red flags" such as fevers, joint pains, or permanent skin changes that would be concerning for a more serious cause of hives.  - We will get some labs to rule out serious causes of hives: complete blood count, tryptase level, chronic urticaria panel, CMP, ESR, and CRP. - We are going to get environmental allergy testing via the blood. - We are going to get some labs to look for other weird causes of hives, including some blood cancers (although I do not think that this is likely).  - You are up to date on all of your cancer screenings, which is good news.  - Chronic hives are often times a self limited process and will "burn themselves out" over 6-12 months, although this is not always the case.  - In the meantime, start suppressive dosing of antihistamines:   - Morning: Xyzal 5 mg + Pepcid (famotidine) 20mg   - Evening: Xyzal 5 mg + Pepcid (famotidine) 20mg  - You can change this dosing at home, decreasing the dose as needed or increasing the dosing as needed.  - We can consider adding on Dupixent for better control. - However, we need to try some other ointments first:  - Triamcinolone cream twice daily for two weeks and and then twice daily as needed thereafter.  - Pimecrolimus ointment twice daily as needed for itching/rash (safe to use over the entire body).  2. Return in about 2 weeks (around 02/01/2024). You can have the follow up appointment with Dr. Idolina Maker or a Nurse Practicioner (our Nurse Practitioners are excellent and always have Physician oversight!).      This note in its entirety was forwarded to the Provider who requested this consultation.  Subjective:   Rebekah Schmidt is a 73 y.o. female presenting today for evaluation of  Chief Complaint  Patient  presents with   Urticaria    Pt upper and lower back. Pt also has a runny nose, sneezing, itchy watery eyes and pressure in her face.   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  Discussed the use of AI scribe software for clinical note transcription with the patient and/or guardian, who gave verbal consent to proceed.  Rebekah Schmidt was referred by Huston Maiers, MD.     Rebekah Schmidt is a 73 y.o. female presenting for an evaluation of urticaria.  She has been experiencing persistent itching since March or April, primarily on her left shoulder, with random spots appearing on her body. A similar episode occurred about a year ago, following insect bites on a hot day, with the rash spreading from her feet to her calf and shoulder.  She was previously diagnosed with scabies by a dermatologist without a skin scrape and treated with permethrin cream, which she discontinued due to severe irritation. A biopsy suggested hypersensitivity to medication or a histamine reaction. Prednisone and  ivermectin provided temporary relief, but symptoms returned after discontinuation.  She has a history of sinus infections and was tested for allergies, revealing high histamine levels but no specific allergies to common allergens. She has tried Xyzal, which took six months to alleviate symptoms, and Allegra, which was ineffective. Over-the-counter treatments like calamine lotion and Vaseline have had limited success.  She has a history of fatty liver and is cautious about medications affecting her liver. She has not started any new medications  recently and denies new environmental exposures or changes in her household. She has two dogs and three cats, which have not caused issues in the past.  She feels sluggish and congested, with no fever. She has a history of dry skin and uses Vaseline Intensive Care and CeraVe for moisture. Various steroid ointments, including fluticasone and betamethasone, have not provided significant improvement.  She underwent stricture surgery in 2020 and had a colonoscopy and endoscopy in April 2025, which found a polyp that was removed. Extensive testing at that time ruled out liver issues as a cause for her itching.     Otherwise, there is no history of other atopic diseases, including drug allergies, stinging insect allergies, or contact dermatitis. There is no significant infectious history. Vaccinations are up to date.    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 If you have any questions, ask your nurse or doctor.          cyclobenzaprine  5 MG tablet Commonly known as: FLEXERIL  Take 5 mg by mouth daily as needed for muscle spasms.   fexofenadine 180 MG tablet Commonly known as: ALLEGRA Take 180 mg by mouth every evening.   hyoscyamine  0.125 MG tablet Commonly known as: LEVSIN Take 0.125 mg by mouth 2 (two) times daily as needed (abdominal spasms).   LORazepam  0.5 MG tablet Commonly known as: ATIVAN  Take 0.5 mg by mouth 3 (three) times daily as needed for anxiety.   metoprolol  succinate 25 MG 24 hr tablet Commonly known as: TOPROL -XL Take  25 mg by mouth daily.   omeprazole 20 MG capsule Commonly known as: PRILOSEC Take 20 mg by mouth in the morning and at bedtime.   pimecrolimus 1 % cream Commonly known as: Elidel Apply topically 2 (two) times daily. Started by: [place] Chuck   predniSONE 10 MG tablet Commonly known as: DELTASONE Take one tablet (10mg ) twice daily for six days days, then one tablet (10mg ) once daily for six days, then STOP. Started by: [place] Chuck   PRESERVISION AREDS 2 PO Take 1 tablet by mouth every evening.   SYSTANE OP Place 1 drop into both eyes 3 (three) times daily as needed (dry/irritated eyes.).   triamcinolone cream 0.1 % Commonly known as: KENALOG Apply 1 Application topically 2 (two) times daily. Started by: Rochester Chuck   zolpidem  10 MG tablet Commonly known as: AMBIEN  Take 10 mg by mouth at bedtime.  Birth History: non-contributory  Developmental History: non-contributory  Past Surgical History: Past Surgical History:  Procedure Laterality Date   ABDOMINAL HYSTERECTOMY  1999   ANKLE SURGERY     left ankle with 2 plates and screws   APPENDECTOMY     BACK SURGERY  05/2022   CHOLECYSTECTOMY     COLON RESECTION  08/2018   diverticulitis   COLONOSCOPY     x 2 2007 was Negative, last one was on 05/22/18   ESOPHAGOGASTRODUODENOSCOPY  04/2011   Hiatal hernia with esophagitis no Barrett's, repeat 2018 - fundic gland polyps   PROCTOSCOPY N/A 09/06/2018   Procedure: RIGID PROCTOSCOPY;  Surgeon: Candyce Champagne, MD;  Location: [facility];  Service: General;  Laterality: N/A;   TUBAL LIGATION     UPPER GASTROINTESTINAL ENDOSCOPY  06/24/2017     Family History: Family History  Problem Relation Age of Onset   Stomach cancer Neg Hx    Colon cancer Neg Hx    Colon polyps Neg Hx    Esophageal cancer Neg Hx    Rectal cancer Neg Hx      Social History: Inette lives at home with her family. There is a house that is 71 years old. There is hardwood in  the main living areas and carpeting in the bedrooms. There is electric heating and central cooling with window units.  They have 2 dogs and 3 cats inside the home.  There are dust mite covers on the bed and the pillows.  There is no tobacco exposure.  She is from [place] .  There is no fume, chemical, or dust exposure.  She does use a HEPA filter in the home.  She does not live near an interstate or industrial area.   Review of systems otherwise negative other than that mentioned in the HPI.    Objective:   Blood pressure 130/68, pulse 98, temperature 98 F (36.7 C), temperature source Temporal, resp. rate 18, height 5' 4.17" (1.63 m), weight 163 lb (73.9 kg), SpO2 98%. Body mass index is 27.83 kg/m.     Physical Exam Vitals reviewed.  Constitutional:      Appearance: She is well-developed.  HENT:     Head: Normocephalic and atraumatic.     Right Ear: Tympanic membrane, ear canal and external ear normal. No drainage, swelling or tenderness. Tympanic membrane is not injected, scarred, erythematous, retracted or bulging.     Left Ear: Tympanic membrane, ear canal and external ear normal. No drainage, swelling or tenderness. Tympanic membrane is not injected, scarred, erythematous, retracted or bulging.     Nose: No nasal deformity, septal deviation, mucosal edema or rhinorrhea.     Right Turbinates: Enlarged, swollen and pale.     Left Turbinates: Enlarged, swollen and pale.     Right Sinus: No maxillary sinus tenderness or frontal sinus tenderness.     Left Sinus: No maxillary sinus tenderness or frontal sinus tenderness.     Mouth/Throat:     Lips: Pink.     Mouth: Mucous membranes are moist. Mucous membranes are not pale and not dry.     Pharynx: Uvula midline.  Eyes:     General: Lids are normal. Allergic shiner present.        Right eye: No discharge.        Left eye: No discharge.     Conjunctiva/sclera: Conjunctivae normal.     Right eye: Right conjunctiva is not injected.  No chemosis.    Left eye: Left conjunctiva is not  injected. No chemosis.    Pupils: Pupils are equal, round, and reactive to light.  Cardiovascular:     Rate and Rhythm: Normal rate and regular rhythm.     Heart sounds: Normal heart sounds.  Pulmonary:     Effort: Pulmonary effort is normal. No tachypnea, accessory muscle usage or respiratory distress.     Breath sounds: Normal breath sounds. No wheezing, rhonchi or rales.  Chest:     Chest wall: No tenderness.  Abdominal:     Tenderness: There is no abdominal tenderness. There is no guarding or rebound.  Lymphadenopathy:     Head:     Right side of head: No submandibular, tonsillar or occipital adenopathy.     Left side of head: No submandibular, tonsillar or occipital adenopathy.     Cervical: No cervical adenopathy.  Skin:    Coloration: Skin is not pale.     Findings: No abrasion, erythema, petechiae or rash. Rash is not papular, urticarial or vesicular.  Neurological:     Mental Status: She is alert.  Psychiatric:        Behavior: Behavior is cooperative.            Diagnostic studies: labs sent instead         Drexel Gentles, MD [facility] 

## 2024-01-18 NOTE — Patient Instructions (Addendum)
 1. Itching - Your history does not have any "red flags" such as fevers, joint pains, or permanent skin changes that would be concerning for a more serious cause of hives.  - We will get some labs to rule out serious causes of hives: complete blood count, tryptase level, chronic urticaria panel, CMP, ESR, and CRP. - We are going to get environmental allergy testing via the blood. - We are going to get some labs to look for other weird causes of hives, including some blood cancers (although I do not think that this is likely).  - You are up to date on all of your cancer screenings, which is good news.  - Chronic hives are often times a self limited process and will "burn themselves out" over 6-12 months, although this is not always the case.  - In the meantime, start suppressive dosing of antihistamines:   - Morning: Xyzal 5 mg + Pepcid (famotidine) 20mg   - Evening: Xyzal 5 mg + Pepcid (famotidine) 20mg  - You can change this dosing at home, decreasing the dose as needed or increasing the dosing as needed.  - We can consider adding on Dupixent for better control. - However, we need to try some other ointments first:  - Triamcinolone cream twice daily for two weeks and and then twice daily as needed thereafter.  - Pimecrolimus ointment twice daily as needed for itching/rash (safe to use over the entire body).  2. Return in about 2 weeks (around 02/01/2024). You can have the follow up appointment with Dr. Idolina Maker or a Nurse Practicioner (our Nurse Practitioners are excellent and always have Physician oversight!).    Please inform us  of any Emergency Department visits, hospitalizations, or changes in symptoms. Call us  before going to the ED for breathing or allergy symptoms since we might be able to fit you in for a sick visit. Feel free to contact us  anytime with any questions, problems, or concerns.  It was a pleasure to meet you today!  Websites that have reliable patient information: 1.  American Academy of Asthma, Allergy, and Immunology: www.aaaai.org 2. Food Allergy Research and Education (FARE): foodallergy.org 3. Mothers of Asthmatics: http://www.asthmacommunitynetwork.org 4. American College of Allergy, Asthma, and Immunology: www.acaai.org      "Like" us  on Facebook and Instagram for our latest updates!      A healthy democracy works best when [employer] participate! Make sure you are registered to vote! If you have moved or changed any of your contact information, you will need to get this updated before voting! Scan the QR codes below to learn more!

## 2024-01-21 LAB — UPEP/TP, 24-HR URINE

## 2024-01-24 LAB — UPEP/TP, 24-HR URINE
Albumin, U: 35.9 %
Alpha 1, Urine: 5.7 %
Alpha 2, Urine: 11.4 %
Beta, Urine: 26.6 %
Gamma Globulin, Urine: 20.4 %
Protein, 24H Urine: 245 mg/(24.h) — ABNORMAL HIGH (ref 30–150)
Protein, Ur: 16.3 mg/dL

## 2024-01-25 LAB — PROTEIN ELECTROPHORESIS, SERUM, WITH REFLEX
A/G Ratio: 1.1 (ref 0.7–1.7)
Albumin ELP: 3.8 g/dL (ref 2.9–4.4)
Alpha 1: 0.2 g/dL (ref 0.0–0.4)
Alpha 2: 0.9 g/dL (ref 0.4–1.0)
Beta: 1.2 g/dL (ref 0.7–1.3)
Gamma Globulin: 1 g/dL (ref 0.4–1.8)
Globulin, Total: 3.4 g/dL (ref 2.2–3.9)

## 2024-01-25 LAB — ALLERGENS W/COMP RFLX AREA 2
Alternaria Alternata IgE: <0.1 kU/L
Aspergillus Fumigatus IgE: <0.1 kU/L
Bermuda Grass IgE: <0.1 kU/L
Cedar, Mountain IgE: <0.1 kU/L
Cladosporium Herbarum IgE: <0.1 kU/L
Cockroach, German IgE: <0.1 kU/L
Common Silver Birch IgE: <0.1 kU/L
Cottonwood IgE: <0.1 kU/L
D Farinae IgE: <0.1 kU/L
D Pteronyssinus IgE: <0.1 kU/L
E001-IgE Cat Dander: <0.1 kU/L
E005-IgE Dog Dander: <0.1 kU/L
Elm, American IgE: <0.1 kU/L
Johnson Grass IgE: <0.1 kU/L
Maple/Box Elder IgE: <0.1 kU/L
Mouse Urine IgE: <0.1 kU/L
Oak, White IgE: <0.1 kU/L
Pecan, Hickory IgE: <0.1 kU/L
Penicillium Chrysogen IgE: <0.1 kU/L
Pigweed, Rough IgE: <0.1 kU/L
Ragweed, Short IgE: <0.1 kU/L
Sheep Sorrel IgE Qn: <0.1 kU/L
Timothy Grass IgE: <0.1 kU/L
White Mulberry IgE: <0.1 kU/L

## 2024-01-25 LAB — CMP14+EGFR
AST: 25 IU/L (ref 0–40)
Albumin: 4.4 g/dL (ref 3.9–4.9)
Alkaline Phosphatase: 168 IU/L — ABNORMAL HIGH (ref 44–121)
BUN/Creatinine Ratio: 14 (ref 12–28)
BUN: 10 mg/dL (ref 8–27)
Bilirubin Total: 0.3 mg/dL (ref 0.0–1.2)
CO2: 21 mmol/L (ref 20–29)
Calcium: 9.7 mg/dL (ref 8.7–10.3)
Chloride: 104 mmol/L (ref 96–106)
Creatinine, Ser: 0.72 mg/dL (ref 0.57–1.00)
Globulin, Total: 2.8 g/dL (ref 1.5–4.5)
Glucose: 90 mg/dL (ref 70–99)
Potassium: 4.6 mmol/L (ref 3.5–5.2)
Sodium: 141 mmol/L (ref 134–144)
Total Protein: 7.2 g/dL (ref 6.0–8.5)
eGFR: 90 mL/min/{1.73_m2} (ref >59)

## 2024-01-25 LAB — CHRONIC URTICARIA (CU) EVAL
ALT: 23 IU/L (ref 0–32)
Basophils Absolute: 0.1 10*3/uL (ref 0.0–0.2)
Basos: 1 %
CRP: 11 mg/L — ABNORMAL HIGH (ref 0–10)
EOS (ABSOLUTE): 0.3 10*3/uL (ref 0.0–0.4)
Eos: 3 %
Hematocrit: 42.2 % (ref 34.0–46.6)
Hemoglobin: 13.6 g/dL (ref 11.1–15.9)
Immature Grans (Abs): 0 10*3/uL (ref 0.0–0.1)
Immature Granulocytes: 0 %
Lymphocytes Absolute: 2.6 10*3/uL (ref 0.7–3.1)
Lymphs: 26 %
MCH: 28.7 pg (ref 26.6–33.0)
MCHC: 32.2 g/dL (ref 31.5–35.7)
MCV: 89 fL (ref 79–97)
Monocytes Absolute: 0.7 10*3/uL (ref 0.1–0.9)
Monocytes: 8 %
Neutrophils Absolute: 6.1 10*3/uL (ref 1.4–7.0)
Neutrophils: 62 %
Platelets: 347 10*3/uL (ref 150–450)
RBC: 4.74 x10E6/uL (ref 3.77–5.28)
RDW: 11.9 % (ref 11.7–15.4)
Sed Rate: 15 mm/h (ref 0–40)
TSH: 0.695 u[IU]/mL (ref 0.450–4.500)
Thyroperoxidase Ab SerPl-aCnc: 19 [IU]/mL (ref 0–34)
WBC: 9.8 10*3/uL (ref 3.4–10.8)

## 2024-01-25 LAB — ANTINUCLEAR ANTIBODIES, IFA

## 2024-01-25 LAB — ALPHA-GAL PANEL
Allergen Lamb IgE: 0.5 kU/L — AB
Beef IgE: 1.09 kU/L — AB
IgE (Immunoglobulin E), Serum: 27 [IU]/mL (ref 6–495)
O215-IgE Alpha-Gal: 2.27 kU/L — AB
Pork IgE: 0.38 kU/L — AB

## 2024-01-25 LAB — TRYPTASE: Tryptase: 5.7 ug/L (ref 2.2–13.2)

## 2024-01-25 LAB — THYROID ANTIBODIES (THYROPEROXIDASE & THYROGLOBULIN): Thyroglobulin Antibody: <1 [IU]/mL (ref 0.0–0.9)

## 2024-01-26 ENCOUNTER — Ambulatory Visit: Payer: Self-pay | Admitting: Allergy & Immunology

## 2024-02-06 ENCOUNTER — Telehealth: Payer: Self-pay

## 2024-02-06 NOTE — Telephone Encounter (Signed)
*  AA  Pharmacy Patient Advocate Encounter   Received notification from CoverMyMeds that prior authorization for Pimecrolimus  1% cream  is required/requested.   Insurance verification completed.   The patient is insured through [employer] .   Per test claim: PA required; PA submitted to above mentioned insurance via CoverMyMeds Key/confirmation #/EOC [facility] Status is pending

## 2024-02-06 NOTE — Telephone Encounter (Signed)
 PA Case: [id number], Status: Approved, Coverage Starts on: 08/17/2023 12:00:00 AM, Coverage Ends on: 08/15/2024 12:00:00 AM. Questions? Contact [phone].

## 2024-02-08 ENCOUNTER — Encounter: Payer: Self-pay | Admitting: Allergy & Immunology

## 2024-02-08 ENCOUNTER — Other Ambulatory Visit: Payer: Self-pay

## 2024-02-08 ENCOUNTER — Ambulatory Visit (INDEPENDENT_AMBULATORY_CARE_PROVIDER_SITE_OTHER): Admitting: Family Medicine

## 2024-02-08 VITALS — BP 130/80 | HR 94 | Temp 98.2°F | Resp 18 | Ht 65.0 in | Wt 166.5 lb

## 2024-02-08 DIAGNOSIS — R899 Unspecified abnormal finding in specimens from other organs, systems and tissues: Secondary | ICD-10-CM

## 2024-02-08 DIAGNOSIS — L2084 Intrinsic (allergic) eczema: Secondary | ICD-10-CM | POA: Diagnosis not present

## 2024-02-08 DIAGNOSIS — Z91018 Allergy to other foods: Secondary | ICD-10-CM

## 2024-02-08 DIAGNOSIS — L299 Pruritus, unspecified: Secondary | ICD-10-CM | POA: Diagnosis not present

## 2024-02-08 NOTE — Progress Notes (Signed)
   [address] [place] [address] Dept: [phone]  FOLLOW UP NOTE  Patient ID: Rebekah Schmidt, female    DOB: 09/27/1950  Age: 73 y.o. MRN: [id number] Date of Office Visit: 02/08/2024  Assessment  Chief Complaint: Follow-up (Pt presents to the office to go over blood work that was recently done. Still having the itching, reports that it is trying to heal.)  HPI Rebekah Schmidt is a 73 year old female who returns to the clinic for follow-up visit.  She was last seen in this clinic on 01/18/2024 by Dr. Iva for evaluation of pruritus with a duration of about 1 year.  At that time, lab work was ordered and was positive to alpha gal, negative to environmental panel, elevated alkaline phosphatase and was positive to protein in the 24-hour urine collection.  At today's visit, she reports that she continues to experience itch which is somewhat quelled by high-dose antihistamines, however, she reports that in the morning before she takes antihistamines she begins to itch and then in the evening she begins to itch just before its time to take antihistamines again.  She continues to experience red and itchy areas occurring on her shoulders, arms, and legs.  She continues Xyzol twice a day and famotidine  twice a day.  She continues twice a day moisturizing routine and uses triamcinolone  and pimecrolimus  as needed.  She is interested in beginning Dupixent at this time for control of atopic dermatitis.  She continues to avoid mammalian products including mammalian meat and milk products with no accidental ingestion or EpiPen  use since her last visit to this clinic.  Her last alpha gal testing on 01/18/2024 indicated alpha gal IgE 2.27, lamb IgE 0.5, beef IgE 0.19, and pork IgE 0.38. Discussed the use of AI scribe software for clinical note transcription with the patient, who gave verbal consent to proceed.  History of Present Illness      Drug Allergies:  Allergies  Allergen  Reactions   Morphine And Codeine Itching    Physical Exam: BP 130/80 (BP Location: Left Arm, Patient Position: Sitting)   Pulse 94   Temp 98.2 F (36.8 C) (Temporal)   Resp 18   Ht 5' 5 (1.651 m)   Wt 166 lb 8 oz (75.5 kg)   SpO2 95%   BMI 27.71 kg/m    Physical Exam  Diagnostics:    Assessment and Plan: No diagnosis found.  No orders of the defined types were placed in this encounter.   There are no Patient Instructions on file for this visit.  No follow-ups on file.    Thank you for the opportunity to care for this patient.  Please do not hesitate to contact me with questions.  Arlean Mutter, FNP [facility]

## 2024-02-08 NOTE — Patient Instructions (Addendum)
 Chronic rhinitis Consider saline nasal rinses as needed for nasal symptoms. Use this before any medicated nasal sprays for best result Begin Flonase 2 sprays in each nostril once a day if needed for a stuffy nose Consider saline nasal rinses as needed for nasal symptoms. Use this before any medicated nasal sprays for best result Consider saline gel as needed for dry nostrils  Chronic urticaria Take the least amount of medications while remaining hive free Levocetirizine (Xyzal ) 5 mg twice a day and famotidine  (Pepcid ) 20 mg twice a day. If no symptoms for 7-14 days then decrease to. Levocetirizine (Xyzal ) 5 mg twice a day and famotidine  (Pepcid ) 20 mg once a day.  If no symptoms for 7-14 days then decrease to. Levocetirizine (Xyzal ) 5 mg twice a day.  If no symptoms for 7-14 days then decrease to. Levocetirizine (Xyzal ) 5 mg once a day.   If your symptoms re-occur, begin a journal of events that occurred for up to 6 hours before your symptoms began including foods and beverages consumed, soaps or perfumes you had contact with, and medications.   Abnormal lab values Follow up with your primary care provider for further evaluation  Atopic dermatitis Continue a twice a day moisturizing routine  Continue triamcinolone  to red and itchy areas under your face Continue pimecrolimus  twice a day to red and itchy areas if needed We will submit for Dupixent for atopic dermatitis. You will next hear from our biologic medication coordinator, Tammy, with next steps  Call the clinic if this treatment plan is not working well for you  Follow up in 3 months or sooner if needed.

## 2024-02-09 ENCOUNTER — Encounter: Payer: Self-pay | Admitting: Family Medicine

## 2024-02-09 ENCOUNTER — Ambulatory Visit: Payer: Self-pay | Admitting: Family Medicine

## 2024-02-09 DIAGNOSIS — Z91018 Allergy to other foods: Secondary | ICD-10-CM | POA: Insufficient documentation

## 2024-02-09 DIAGNOSIS — R899 Unspecified abnormal finding in specimens from other organs, systems and tissues: Secondary | ICD-10-CM | POA: Insufficient documentation

## 2024-02-09 DIAGNOSIS — L299 Pruritus, unspecified: Secondary | ICD-10-CM | POA: Insufficient documentation

## 2024-02-09 DIAGNOSIS — L2084 Intrinsic (allergic) eczema: Secondary | ICD-10-CM | POA: Insufficient documentation

## 2024-02-09 DIAGNOSIS — L209 Atopic dermatitis, unspecified: Secondary | ICD-10-CM | POA: Insufficient documentation

## 2024-02-09 LAB — ALKALINE PHOSPHATASE: Alkaline Phosphatase: 140 IU/L — ABNORMAL HIGH (ref 44–121)

## 2024-02-09 NOTE — Progress Notes (Signed)
 Can you please let this patient know that her alk phos is still elevated. Please forward this lab to her PCP for further review. Thank you

## 2024-02-10 ENCOUNTER — Ambulatory Visit: Admitting: Allergy & Immunology

## 2024-02-13 ENCOUNTER — Telehealth: Payer: Self-pay | Admitting: *Deleted

## 2024-02-13 ENCOUNTER — Ambulatory Visit (INDEPENDENT_AMBULATORY_CARE_PROVIDER_SITE_OTHER)

## 2024-02-13 DIAGNOSIS — L209 Atopic dermatitis, unspecified: Secondary | ICD-10-CM

## 2024-02-13 MED ORDER — DUPIXENT 300 MG/2ML ~~LOC~~ SOSY: 300.0000 mg | PREFILLED_SYRINGE | SUBCUTANEOUS | 4 refills | Status: DC

## 2024-02-13 MED ORDER — DUPILUMAB 300 MG/2ML ~~LOC~~ SOSY
300.0000 mg | PREFILLED_SYRINGE | SUBCUTANEOUS | Status: AC
  Administered 2024-02-13: 300 mg via SUBCUTANEOUS

## 2024-02-13 NOTE — Telephone Encounter (Signed)
 L/m for patient to contact me to discuss Dupixent, she has [employer] so she should be able to get her meds through [facility]

## 2024-02-13 NOTE — Telephone Encounter (Signed)
 Spoke to patient and advised submit to [facility] due to her [employer] coverage and will send rx. She can choose to bring to clinic for admin, instruction or do at home, reviewed dosing for same

## 2024-02-13 NOTE — Telephone Encounter (Signed)
-----   Message from Arlean Mutter sent at 02/09/2024 10:08 AM EDT ----- Hi Hillis Mcphatter, Can you please submit for Dupixent for AD for this patient? Thank you

## 2024-02-13 NOTE — Progress Notes (Signed)
 Immunotherapy   Patient Details  Name: Rebekah Schmidt MRN: [id number] Date of Birth: April 10, 1951  02/13/2024  Rebekah Schmidt started injections for  Dupixent.  Following schedule: Every fourteen days. Frequency:Every two weeks.  Epi-Pen:Not needed.  Consent signed in office today and patient instructions given. Patient sat in the lobby for fifteen minutes without an issue.    Rebekah Schmidt 02/13/2024, 1:39 PM

## 2024-02-14 NOTE — Telephone Encounter (Signed)
 Amazing!  Thank you

## 2024-03-07 ENCOUNTER — Other Ambulatory Visit (HOSPITAL_COMMUNITY): Payer: Self-pay

## 2024-03-07 MED ORDER — DUPIXENT 300 MG/2ML ~~LOC~~ SOSY
300.0000 mg | PREFILLED_SYRINGE | SUBCUTANEOUS | 11 refills | Status: DC
Start: 2024-03-07 — End: 2024-03-07

## 2024-03-07 NOTE — Addendum Note (Signed)
 Addended by: OTHA MADELIN HERO on: 03/07/2024 01:47 PM   Modules accepted: Orders

## 2024-03-07 NOTE — Addendum Note (Signed)
 Addended by: OTHA MADELIN HERO on: 03/07/2024 04:14 PM   Modules accepted: Orders

## 2024-03-09 ENCOUNTER — Other Ambulatory Visit: Payer: Self-pay | Admitting: Allergy & Immunology

## 2024-03-14 ENCOUNTER — Other Ambulatory Visit: Payer: Self-pay | Admitting: Allergy & Immunology

## 2024-04-13 NOTE — Telephone Encounter (Signed)
 Can someone schedule an appointment to discuss? Rebekah Schmidt might be seeing some televisits starting next week, so this might be a good one for her.

## 2024-04-30 NOTE — Telephone Encounter (Signed)
 I called the patient and left a message to call the office back to schedule Televisit.

## 2024-05-21 NOTE — Progress Notes (Signed)
 [address] [place] [address] Dept: [phone]  FOLLOW UP NOTE  Patient ID: Rebekah Schmidt, female    DOB: 10-03-1950  Age: 73 y.o. MRN: [id number] Date of Office Visit: 05/23/2024  Assessment  Chief Complaint: Allergic Rhinitis  (Pt is here for her f/u appt, She started dupixent  but states she is still itching and hasn't noticed much change with symptoms. She mentioned she had a rash on her arm a few weeks back, she has noticed she is bruising easily and her skin is tearing as well.)  HPI Rebekah Schmidt is a 73 year old female who presents to the clinic for follow-up visit.  She was last seen in this clinic on 02/08/2024 by Arlean Mutter, FNP, for evaluation of alpha gal allergy, chronic rhinitis, chronic urticaria, atopic dermatitis, and abnormal lab value review.  At that time, she began Dupixent  for atopic dermatitis control. She is requesting a steroid taper to ease her skin issues. She agrees to a chest xray to round out pruritus evaluation.  Discussed the use of AI scribe software for clinical note transcription with the patient, who gave verbal consent to proceed.  History of Present Illness Rebekah Schmidt is a 73 year old female with atopic dermatitis and Alpha-gal syndrome who presents with persistent itching and skin eruptions.  She has been experiencing persistent itching and skin eruptions primarily on her left side for almost a year and a half. The itching is severe and unrelenting, described as 'something in there that wants to get out.' Random breakouts occur, with some areas healing and leaving purple scars, while others remain unhealed. Touching the spots sometimes sends a 'shockwave' through her body. Various topical treatments, including a cream prescribed by Dr. Iva and over-the-counter cortisone cream, have been ineffective. Previous treatments included prednisone , which provided temporary relief, and ivermectin, which was effective for about three  weeks before symptoms returned. A biopsy performed a year ago suggested an overreaction to medication or a bug bite. She has a history of dry, flaky skin, but never experienced symptoms like this until the original insect bite. She continues Dupixent  injections once every 2 weeks with only slight improvement in her symptoms. She is currently taking Xyzal , two tablets daily, and Pepcid , one in the morning and one at night.   The symptoms began in June 2024 after being bitten by an unknown insect, possibly a tick or chigger, leading to a diagnosis of Alpha-gal syndrome. Despite dietary changes, including avoiding beef, pork, lamb and mammal products including milk, cheese and butter, she continues to experience breakouts. She uses the Fig app to manage her diet by avoiding mammal products. Her last alpha gal testing on 01/18/2024 indicated alpha gal IgE 2.27 with positive components. She has also been using a probiotic without noticeable improvement in her abdominal symptoms.   Chronic rhinitis is reported as well controlled with only occasional nasal symptoms. She continues antihistamines with relief of symptoms. Her last environmental allergy testing via lab on 01/18/2024 was negative to the environmental panel.    Her current medications are listed in the chart.    Drug Allergies:  Allergies  Allergen Reactions   Morphine And Codeine Itching    Physical Exam: BP 134/84 (BP Location: Left Arm, Patient Position: Sitting, Cuff Size: Normal)   Pulse 84   Temp 98.6 F (37 C) (Temporal)   Ht 5' 5 (1.651 m)   Wt 165 lb 3.2 oz (74.9 kg)   SpO2 95%   BMI 27.49  kg/m    Physical Exam Vitals reviewed.  Constitutional:      Appearance: Normal appearance.  HENT:     Head: Normocephalic and atraumatic.     Right Ear: Tympanic membrane normal.     Left Ear: Tympanic membrane normal.     Nose:     Comments: Bilateral nares normal. Pharynx normal. Ears normal. Eyes normal.    Mouth/Throat:      Pharynx: Oropharynx is clear.  Eyes:     Conjunctiva/sclera: Conjunctivae normal.  Cardiovascular:     Rate and Rhythm: Normal rate and regular rhythm.     Heart sounds: No murmur heard. Pulmonary:     Effort: Pulmonary effort is normal.     Breath sounds: Normal breath sounds.     Comments: Lungs clear to auscultation Musculoskeletal:        General: Normal range of motion.     Cervical back: Normal range of motion and neck supple.  Skin:    Comments: Scattered red circular areas noted mainly on left shoulder and left leg with a few areas on her right side. Areas are in various states including some healed areas with a dusky pink color, some red closed areas and some open areas with scabs.   Neurological:     Mental Status: She is alert and oriented to person, place, and time.  Psychiatric:        Mood and Affect: Mood normal.        Behavior: Behavior normal.        Thought Content: Thought content normal.        Judgment: Judgment normal.    Assessment and Plan: 1. Itching   2. Allergy to alpha-gal   3. Chronic rhinitis   4. Atopic dermatitis, unspecified type     Meds ordered this encounter  Medications   triamcinolone  cream (KENALOG ) 0.1 %    Sig: Apply topically 2 (two) times daily.    Dispense:  454 g    Refill:  0   predniSONE  (DELTASONE ) 10 MG tablet    Sig: Begin prednisone  10 mg tablets. Take 2 tablets twice a day for 3 days, then take 2 tablets once a day for 1 day, then take 1 tablet on the 5th day, then stop    Dispense:  15 tablet    Refill:  0    Patient Instructions  Alpha gal allergy Continue to avoid mammalian meat and mammalian products In case of an allergic reaction, take cetirizine 10 to 20 mg once a day and if life-threatening symptoms occur, inject with EpiPen  0.3 mg.  Chronic rhinitis Consider saline nasal rinses as needed for nasal symptoms. Use this before any medicated nasal sprays for best result Continue Flonase 2 sprays in each nostril  once a day if needed for a stuffy nose Consider saline nasal rinses as needed for nasal symptoms. Use this before any medicated nasal sprays for best result Consider saline gel as needed for dry nostrils  Chronic urticaria/pruritus Take the least amount of medications while remaining hive free Levocetirizine (Xyzal ) 5 mg twice a day and famotidine  (Pepcid ) 20 mg twice a day. If no symptoms for 7-14 days then decrease to. Levocetirizine (Xyzal ) 5 mg twice a day and famotidine  (Pepcid ) 20 mg once a day.  If no symptoms for 7-14 days then decrease to. Levocetirizine (Xyzal ) 5 mg twice a day.  If no symptoms for 7-14 days then decrease to. Levocetirizine (Xyzal ) 5 mg once a day.   If  your symptoms re-occur, begin a journal of events that occurred for up to 6 hours before your symptoms began including foods and beverages consumed, soaps or perfumes you had contact with, and medications.  Get a chest xray. We will call you when the results become available Begin prednisone  10 mg tablets. Take 2 tablets twice a day for 3 days, then take 2 tablets once a day for 1 day, then take 1 tablet on the 5th day, then stop   Atopic dermatitis Continue a twice a day moisturizing routine  Continue triamcinolone  to red and itchy areas under your face Continue pimecrolimus  twice a day to red and itchy areas if needed Continue Dupixent  for atopic dermatitis.   Call the clinic if this treatment plan is not working well for you  Follow up in 3 months or sooner if needed.  Return in about 3 months (around 08/23/2024), or if symptoms worsen or fail to improve.    Thank you for the opportunity to care for this patient.  Please do not hesitate to contact me with questions.  Arlean Mutter, FNP [facility] 

## 2024-05-21 NOTE — Patient Instructions (Incomplete)
 Alpha gal allergy Continue to avoid mammalian meat and mammalian products In case of an allergic reaction, take cetirizine 10 to 20 mg once a day and if life-threatening symptoms occur, inject with EpiPen  0.3 mg.  Chronic rhinitis Consider saline nasal rinses as needed for nasal symptoms. Use this before any medicated nasal sprays for best result Continue Flonase 2 sprays in each nostril once a day if needed for a stuffy nose Consider saline nasal rinses as needed for nasal symptoms. Use this before any medicated nasal sprays for best result Consider saline gel as needed for dry nostrils  Chronic urticaria/pruritus Take the least amount of medications while remaining hive free Levocetirizine (Xyzal ) 5 mg twice a day and famotidine  (Pepcid ) 20 mg twice a day. If no symptoms for 7-14 days then decrease to. Levocetirizine (Xyzal ) 5 mg twice a day and famotidine  (Pepcid ) 20 mg once a day.  If no symptoms for 7-14 days then decrease to. Levocetirizine (Xyzal ) 5 mg twice a day.  If no symptoms for 7-14 days then decrease to. Levocetirizine (Xyzal ) 5 mg once a day.   If your symptoms re-occur, begin a journal of events that occurred for up to 6 hours before your symptoms began including foods and beverages consumed, soaps or perfumes you had contact with, and medications.  Get a chest xray. We will call you when the results become available Begin prednisone  10 mg tablets. Take 2 tablets twice a day for 3 days, then take 2 tablets once a day for 1 day, then take 1 tablet on the 5th day, then stop   Atopic dermatitis Continue a twice a day moisturizing routine  Continue triamcinolone  to red and itchy areas under your face Continue pimecrolimus  twice a day to red and itchy areas if needed Continue Dupixent  for atopic dermatitis.   Call the clinic if this treatment plan is not working well for you  Follow up in 3 months or sooner if needed.

## 2024-05-23 ENCOUNTER — Ambulatory Visit: Admitting: Family Medicine

## 2024-05-23 ENCOUNTER — Encounter: Payer: Self-pay | Admitting: Family Medicine

## 2024-05-23 ENCOUNTER — Other Ambulatory Visit: Payer: Self-pay | Admitting: Allergy & Immunology

## 2024-05-23 VITALS — BP 134/84 | HR 84 | Temp 98.6°F | Ht 65.0 in | Wt 165.2 lb

## 2024-05-23 DIAGNOSIS — L299 Pruritus, unspecified: Secondary | ICD-10-CM | POA: Diagnosis not present

## 2024-05-23 DIAGNOSIS — L209 Atopic dermatitis, unspecified: Secondary | ICD-10-CM

## 2024-05-23 DIAGNOSIS — J310 Chronic rhinitis: Secondary | ICD-10-CM | POA: Insufficient documentation

## 2024-05-23 DIAGNOSIS — Z91018 Allergy to other foods: Secondary | ICD-10-CM

## 2024-05-23 MED ORDER — TRIAMCINOLONE ACETONIDE 0.1 % EX CREA: TOPICAL_CREAM | Freq: Two times a day (BID) | CUTANEOUS | 0 refills | Status: DC

## 2024-05-23 MED ORDER — PREDNISONE 10 MG PO TABS: ORAL_TABLET | ORAL | 0 refills | Status: AC

## 2024-07-29 ENCOUNTER — Other Ambulatory Visit: Payer: Self-pay | Admitting: Family Medicine

## 2024-08-15 ENCOUNTER — Other Ambulatory Visit: Payer: Self-pay | Admitting: Allergy & Immunology

## 2024-08-28 ENCOUNTER — Other Ambulatory Visit: Payer: Self-pay | Admitting: Family Medicine

## 2024-08-31 ENCOUNTER — Other Ambulatory Visit: Payer: Self-pay | Admitting: Allergy & Immunology

## 2024-09-05 ENCOUNTER — Encounter: Payer: Self-pay | Admitting: Allergy & Immunology

## 2024-09-05 ENCOUNTER — Ambulatory Visit: Admitting: Allergy & Immunology

## 2024-09-05 ENCOUNTER — Other Ambulatory Visit: Payer: Self-pay

## 2024-09-05 VITALS — BP 122/68 | HR 96 | Temp 98.0°F | Resp 18 | Ht 65.0 in | Wt 169.0 lb

## 2024-09-05 DIAGNOSIS — R21 Rash and other nonspecific skin eruption: Secondary | ICD-10-CM

## 2024-09-05 DIAGNOSIS — L299 Pruritus, unspecified: Secondary | ICD-10-CM | POA: Diagnosis not present

## 2024-09-05 DIAGNOSIS — Z91018 Allergy to other foods: Secondary | ICD-10-CM | POA: Diagnosis not present

## 2024-09-05 NOTE — Patient Instructions (Addendum)
 Alpha gal allergy  - Continue to avoid mammalian meat and mammalian products - We are going to recheck your alpha gal levels. - We are also going to get a complete blood count and a tryptase level today. - This will look at your platelet counts and your tryptase which is a sign of mast cell disease.  - EpiPen  is up to date.  - We are going to get some labs to look for allergies to the most common foods. - Try using a probiotic contains any of the following bacteria: Lactobacillus rhamnosus GG, Lactobacillus acidophilus, Lactobacillus plantarum & Lactobacillus reuteri, or Lactobacillus casei.  Chronic rhinitis - Consider saline nasal rinses as needed for nasal symptoms. Use this before any medicated nasal sprays for best result - Continue Flonase 2 sprays in each nostril once a day if needed for a stuffy nose - Consider saline nasal rinses as needed for nasal symptoms. Use this before any medicated nasal sprays for best result - Consider saline gel as needed for dry nostrils  Chronic urticaria/pruritus - OK to restart the antihistamines.  - Take the least amount of medications while remaining hive free Levocetirizine (Xyzal ) 5 mg twice a day and famotidine  (Pepcid ) 20 mg twice a day. If no symptoms for 7-14 days then decrease to Levocetirizine (Xyzal ) 5 mg twice a day and famotidine  (Pepcid ) 20 mg once a day.  If no symptoms for 7-14 days then decrease to Levocetirizine (Xyzal ) 5 mg twice a day.  If no symptoms for 7-14 days then decrease to Levocetirizine (Xyzal ) 5 mg once a day.   Atopic dermatitis - Continue a twice a day moisturizing routine -  - Continue with your triamcinolone  to red and itchy areas under your face.  - Continue pimecrolimus  twice a day to red and itchy areas if needed - I would like you to see Dermatology in case they need to get a sample of the skin to see what might be going on. - Consider adding on a Nemluvio instead of Dupixent .   Right nasal lesion - We can  send to ENT if it is still there next time. - It does not look scary.   Return in about 3 months (around 12/04/2024). You can have the follow up appointment with Dr. Iva or a Nurse Practicioner (our Nurse Practitioners are excellent and always have Physician oversight!).    Please inform us  of any Emergency Department visits, hospitalizations, or changes in symptoms. Call us  before going to the ED for breathing or allergy  symptoms since we might be able to fit you in for a sick visit. Feel free to contact us  anytime with any questions, problems, or concerns.  It was a pleasure to see you again today!  Websites that have reliable patient information: 1. American Academy of Asthma, Allergy , and Immunology: www.aaaai.org 2. Food Allergy  Research and Education (FARE): foodallergy.org 3. Mothers of Asthmatics: http://www.asthmacommunitynetwork.org 4. [employer] of Allergy , Asthma, and Immunology: www.acaai.org      Like us  on [employer] and Instagram for our latest updates!      A healthy democracy works best when [employer] participate! Make sure you are registered to vote! If you have moved or changed any of your contact information, you will need to get this updated before voting! Scan the QR codes below to learn more!

## 2024-09-05 NOTE — Progress Notes (Unsigned)
 "  FOLLOW UP  Date of Service/Encounter:  09/05/24   Assessment:   Itching - unclear etiology  Alpha-gal syndrome  Chronic rhinitis  Plan/Recommendations:   Alpha gal allergy  - Continue to avoid mammalian meat and mammalian products - We are going to recheck your alpha gal levels. - We are also going to get a complete blood count and a tryptase level today. - This will look at your platelet counts and your tryptase which is a sign of mast cell disease.  - EpiPen  is up to date.  - We are going to get some labs to look for allergies to the most common foods. - Try using a probiotic contains any of the following bacteria: Lactobacillus rhamnosus GG, Lactobacillus acidophilus, Lactobacillus plantarum & Lactobacillus reuteri, or Lactobacillus casei.  Chronic rhinitis - Consider saline nasal rinses as needed for nasal symptoms. Use this before any medicated nasal sprays for best result - Continue Flonase 2 sprays in each nostril once a day if needed for a stuffy nose - Consider saline nasal rinses as needed for nasal symptoms. Use this before any medicated nasal sprays for best result - Consider saline gel as needed for dry nostrils  Chronic urticaria/pruritus - OK to restart the antihistamines.  - Take the least amount of medications while remaining hive free Levocetirizine (Xyzal ) 5 mg twice a day and famotidine  (Pepcid ) 20 mg twice a day. If no symptoms for 7-14 days then decrease to Levocetirizine (Xyzal ) 5 mg twice a day and famotidine  (Pepcid ) 20 mg once a day.  If no symptoms for 7-14 days then decrease to Levocetirizine (Xyzal ) 5 mg twice a day.  If no symptoms for 7-14 days then decrease to Levocetirizine (Xyzal ) 5 mg once a day.   Atopic dermatitis - Continue a twice a day moisturizing routine. - Continue with your triamcinolone  to red and itchy areas under your face.  - Continue pimecrolimus  twice a day to red and itchy areas if needed - I would like you to see  Dermatology in case they need to get a sample of the skin to see what might be going on. - Consider adding on a Nemluvio instead of Dupixent .   Right nasal lesion - We can send to ENT if it is still there next time. - It does not look scary.   Return in about 3 months (around 12/04/2024). You can have the follow up appointment with Dr. Iva or a Nurse Practicioner (our Nurse Practitioners are excellent and always have Physician oversight!).     Subjective:   Rebekah Schmidt is a 74 y.o. 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  Discussed the use of AI scribe software for clinical note transcription with the patient and/or guardian, who gave verbal consent to proceed.  Rebekah Schmidt is a 74 y.o. female presenting for a follow  up visit.  She was last seen in October 2025.  At that time, she continued to avoid red meat.  EpiPen  was updated.  For her rhinitis, she continue with Flonase as well as nasal saline rinses.  For her hives, she remained on suppressive doses of antihistamines, weaning at home as tolerated.  She was started on a prednisone  burst.  She was continued on Dupixent  every 2 weeks as well as triamcinolone  and Elidel  as needed.  Since last visit, she has done relatively well.   She experiences persistent skin eruptions characterized by random pink spots resembling measles, which  are itchy. These spots appear all over her body, including her arms, shoulders, and head, and do not completely resolve, although they may fade over time. She continues to use a cream twice daily for the itching.  She has a history of being on Dupixent , which she stopped due to insurance issues. While on Dupixent , she did not achieve complete clearance of her rashes but noted some improvement in itching. Prednisone  provided temporary relief from itching but did not prevent recurrences of the rash. She stopped the Dupixent  due to brain fog.   She has been avoiding red meat, milk, and cheese due to suspected alpha-gal syndrome, but continues to experience breakouts. She has also attempted a low-histamine diet, avoiding foods like spinach, which exacerbated her itching.  She describes additional symptoms of hard bumps under her skin and blood blister-like spots that do not resolve. These symptoms are exacerbated at night when she is tired. She also reports a history of a severe rash on her arm that developed into a scar after starting as a small spot.  She has a history of excessive protein in her urine, leading to a urological evaluation that revealed kidney cysts. Follow-up with a nephrologist was not concerning, and recent tests showed no protein, blood, or infection in her urine.  She takes Ambien  for sleep and has recently stopped taking antihistamines, including Xyzal  and Pepcid , due to concerns about blood work.  She feels exhausted and unmotivated, with a history of sinus infections but none recently. No fever or recent sinus infections.     Skin Symptom History: She was on the Dupixent  every two weeks. She reports that she felt brain fog with it. She did not take any more. She did not have the breakouts like we used to. She has the random breakouts still.   She is better than when we first met her. She has not had beef or hamburger or streak. She has in the last week or so, quit her  antihistamines.   Otherwise, there have been no changes to her past medical history, surgical history, family history, or social history.    Review of systems otherwise negative other than that mentioned in the HPI.    Objective:   Blood pressure 122/68, pulse 96, temperature 98 F (36.7 C), temperature source Temporal, resp. rate 18, height 5' 5 (1.651 m), weight 169 lb (76.7 kg), SpO2 93%. Body mass index is 28.12 kg/m.    Physical Exam Vitals reviewed.  Constitutional:      Appearance: She is well-developed.  HENT:     Head: Normocephalic and atraumatic.     Right Ear: Tympanic membrane, ear canal and external ear normal. No drainage, swelling or tenderness. Tympanic membrane is not injected, scarred, erythematous, retracted or bulging.     Left Ear: Tympanic membrane, ear canal and external ear normal. No drainage, swelling or tenderness. Tympanic membrane is not  injected, scarred, erythematous, retracted or bulging.     Nose: No nasal deformity, septal deviation, mucosal edema or rhinorrhea.     Right Turbinates: Enlarged, swollen and pale.     Left Turbinates: Enlarged, swollen and pale.     Right Sinus: No maxillary sinus tenderness or frontal sinus tenderness.     Left Sinus: No maxillary sinus tenderness or frontal sinus tenderness.     Mouth/Throat:     Lips: Pink.     Mouth: Mucous membranes are moist. Mucous membranes are not pale and not dry.     Pharynx: Uvula midline.  Eyes:     General: Lids are normal. Allergic shiner present.        Right eye: No discharge.        Left eye: No discharge.     Conjunctiva/sclera: Conjunctivae normal.     Right eye: Right conjunctiva is not injected. No chemosis.    Left eye: Left conjunctiva is not injected. No chemosis.    Pupils: Pupils are equal, round, and reactive to light.  Cardiovascular:     Rate and Rhythm: Normal rate and regular rhythm.     Heart sounds: Normal heart sounds.  Pulmonary:     Effort: Pulmonary  effort is normal. No tachypnea, accessory muscle usage or respiratory distress.     Breath sounds: Normal breath sounds. No wheezing, rhonchi or rales.  Chest:     Chest wall: No tenderness.  Abdominal:     Tenderness: There is no abdominal tenderness. There is no guarding or rebound.  Lymphadenopathy:     Head:     Right side of head: No submandibular, tonsillar or occipital adenopathy.     Left side of head: No submandibular, tonsillar or occipital adenopathy.     Cervical: No cervical adenopathy.  Skin:    Coloration: Skin is not pale.     Findings: No abrasion, erythema, petechiae or rash. Rash is not papular, urticarial or vesicular.  Neurological:     Mental Status: She is alert.  Psychiatric:        Behavior: Behavior is cooperative.              Diagnostic studies: none       Marty Shaggy, MD  [facility]        "

## 2024-09-07 ENCOUNTER — Encounter: Payer: Self-pay | Admitting: Allergy & Immunology

## 2024-09-08 LAB — ALLERGY PANEL 18, NUT MIX GROUP
Allergen Coconut IgE: <0.1 kU/L
F020-IgE Almond: <0.1 kU/L
F202-IgE Cashew Nut: <0.1 kU/L
Hazelnut (Filbert) IgE: <0.1 kU/L
Peanut IgE: <0.1 kU/L
Pecan Nut IgE: <0.1 kU/L
Sesame Seed IgE: <0.1 kU/L

## 2024-09-08 LAB — SEDIMENTATION RATE: Sed Rate: 6 mm/h (ref 0–40)

## 2024-09-08 LAB — CBC WITH DIFFERENTIAL/PLATELET
Basophils Absolute: 0.1 10*3/uL (ref 0.0–0.2)
Basos: 1 %
EOS (ABSOLUTE): 0.2 10*3/uL (ref 0.0–0.4)
Eos: 3 %
Hematocrit: 42.4 % (ref 34.0–46.6)
Hemoglobin: 14.1 g/dL (ref 11.1–15.9)
Immature Grans (Abs): 0 10*3/uL (ref 0.0–0.1)
Immature Granulocytes: 0 %
Lymphocytes Absolute: 1.8 10*3/uL (ref 0.7–3.1)
Lymphs: 20 %
MCH: 29.7 pg (ref 26.6–33.0)
MCHC: 33.3 g/dL (ref 31.5–35.7)
MCV: 90 fL (ref 79–97)
Monocytes Absolute: 0.7 10*3/uL (ref 0.1–0.9)
Monocytes: 7 %
Neutrophils Absolute: 6.1 10*3/uL (ref 1.4–7.0)
Neutrophils: 69 %
Platelets: 365 10*3/uL (ref 150–450)
RBC: 4.74 x10E6/uL (ref 3.77–5.28)
RDW: 12.1 % (ref 11.7–15.4)
WBC: 8.8 10*3/uL (ref 3.4–10.8)

## 2024-09-08 LAB — ALPHA-GAL PANEL
Allergen Lamb IgE: 0.42 kU/L — AB
Beef IgE: 0.84 kU/L — AB
IgE (Immunoglobulin E), Serum: 12 [IU]/mL (ref 6–495)
O215-IgE Alpha-Gal: 1.26 kU/L — AB
Pork IgE: 0.37 kU/L — AB

## 2024-09-08 LAB — ALLERGY PANEL 19, SEAFOOD GROUP
Allergen Salmon IgE: <0.1 kU/L
Catfish: <0.1 kU/L
Codfish IgE: <0.1 kU/L
F023-IgE Crab: <0.1 kU/L
F080-IgE Lobster: <0.1 kU/L
Shrimp IgE: <0.1 kU/L
Tuna: <0.1 kU/L

## 2024-09-08 LAB — TRYPTASE: Tryptase: 4.2 ug/L (ref 2.2–13.2)

## 2024-09-08 LAB — ANTINUCLEAR ANTIBODIES, IFA: ANA Titer 1: NEGATIVE

## 2024-09-08 LAB — ALLERGEN SOYBEAN: Soybean IgE: <0.1 kU/L

## 2024-09-08 LAB — F245-IGE EGG, WHOLE: Egg, Whole IgE: <0.1 kU/L

## 2024-09-08 LAB — ALLERGEN, WHEAT, F4: Wheat IgE: <0.1 kU/L

## 2024-09-08 LAB — ALLERGEN MILK: Milk IgE: <0.1 kU/L

## 2024-09-08 LAB — C-REACTIVE PROTEIN: CRP: 8 mg/L (ref 0–10)

## 2024-09-18 ENCOUNTER — Ambulatory Visit: Payer: Self-pay | Admitting: Allergy & Immunology

## 2024-12-05 ENCOUNTER — Ambulatory Visit: Admitting: Family Medicine

## 2025-05-27 ENCOUNTER — Ambulatory Visit: Admitting: Physician Assistant
